# Patient Record
Sex: Male | Born: 1955
Health system: Southern US, Community
[De-identification: ages and names within clinical notes are randomized; demographics above are authoritative.]

## PROBLEM LIST (undated history)

## (undated) DIAGNOSIS — J449 Chronic obstructive pulmonary disease, unspecified: Secondary | ICD-10-CM

## (undated) DIAGNOSIS — F329 Major depressive disorder, single episode, unspecified: Secondary | ICD-10-CM

## (undated) DIAGNOSIS — F32A Depression, unspecified: Secondary | ICD-10-CM

## (undated) DIAGNOSIS — J4489 Other specified chronic obstructive pulmonary disease: Secondary | ICD-10-CM

## (undated) HISTORY — DX: Other specified chronic obstructive pulmonary disease: J44.89

## (undated) HISTORY — DX: Chronic obstructive pulmonary disease, unspecified: J44.9

---

## 1999-03-23 ENCOUNTER — Encounter: Payer: Self-pay | Admitting: Emergency Medicine

## 1999-03-23 ENCOUNTER — Emergency Department (HOSPITAL_COMMUNITY): Admission: EM | Admit: 1999-03-23 | Discharge: 1999-03-23 | Payer: Self-pay | Admitting: Emergency Medicine

## 1999-03-27 ENCOUNTER — Emergency Department (HOSPITAL_COMMUNITY): Admission: EM | Admit: 1999-03-27 | Discharge: 1999-03-27 | Payer: Self-pay | Admitting: Emergency Medicine

## 2002-12-03 ENCOUNTER — Emergency Department (HOSPITAL_COMMUNITY): Admission: EM | Admit: 2002-12-03 | Discharge: 2002-12-03 | Payer: Self-pay | Admitting: Emergency Medicine

## 2015-12-18 ENCOUNTER — Emergency Department (HOSPITAL_COMMUNITY)
Admission: EM | Admit: 2015-12-18 | Discharge: 2015-12-18 | Disposition: A | Discharge status: Home / Self Care | Payer: Medicaid Other | Attending: Emergency Medicine | Admitting: Emergency Medicine

## 2015-12-18 ENCOUNTER — Encounter (HOSPITAL_COMMUNITY): Payer: Self-pay | Admitting: *Deleted

## 2015-12-18 ENCOUNTER — Emergency Department (HOSPITAL_COMMUNITY): Payer: Medicaid Other

## 2015-12-18 DIAGNOSIS — Z880 Allergy status to penicillin: Secondary | ICD-10-CM | POA: Diagnosis not present

## 2015-12-18 DIAGNOSIS — F329 Major depressive disorder, single episode, unspecified: Secondary | ICD-10-CM | POA: Insufficient documentation

## 2015-12-18 DIAGNOSIS — Z79899 Other long term (current) drug therapy: Secondary | ICD-10-CM | POA: Insufficient documentation

## 2015-12-18 DIAGNOSIS — F17200 Nicotine dependence, unspecified, uncomplicated: Secondary | ICD-10-CM | POA: Diagnosis not present

## 2015-12-18 DIAGNOSIS — R0602 Shortness of breath: Secondary | ICD-10-CM | POA: Diagnosis present

## 2015-12-18 DIAGNOSIS — J441 Chronic obstructive pulmonary disease with (acute) exacerbation: Secondary | ICD-10-CM | POA: Diagnosis not present

## 2015-12-18 HISTORY — DX: Depression, unspecified: F32.A

## 2015-12-18 HISTORY — DX: Major depressive disorder, single episode, unspecified: F32.9

## 2015-12-18 LAB — CBC WITH DIFFERENTIAL/PLATELET
BASOS ABS: 0 10*3/uL (ref 0.0–0.1)
Basophils Relative: 0 %
Eosinophils Absolute: 0 10*3/uL (ref 0.0–0.7)
Eosinophils Relative: 0 %
HEMATOCRIT: 46 % (ref 39.0–52.0)
Hemoglobin: 15.8 g/dL (ref 13.0–17.0)
LYMPHS ABS: 1.9 10*3/uL (ref 0.7–4.0)
LYMPHS PCT: 24 %
MCH: 31.2 pg (ref 26.0–34.0)
MCHC: 34.3 g/dL (ref 30.0–36.0)
MCV: 90.9 fL (ref 78.0–100.0)
MONO ABS: 0.4 10*3/uL (ref 0.1–1.0)
Monocytes Relative: 5 %
NEUTROS ABS: 5.6 10*3/uL (ref 1.7–7.7)
Neutrophils Relative %: 71 %
Platelets: 133 10*3/uL — ABNORMAL LOW (ref 150–400)
RBC: 5.06 MIL/uL (ref 4.22–5.81)
RDW: 12.4 % (ref 11.5–15.5)
WBC: 7.8 10*3/uL (ref 4.0–10.5)

## 2015-12-18 LAB — BASIC METABOLIC PANEL
ANION GAP: 12 (ref 5–15)
BUN: 8 mg/dL (ref 6–20)
CALCIUM: 9.2 mg/dL (ref 8.9–10.3)
CO2: 28 mmol/L (ref 22–32)
Chloride: 100 mmol/L — ABNORMAL LOW (ref 101–111)
Creatinine, Ser: 1.19 mg/dL (ref 0.61–1.24)
GFR calc Af Amer: >60 mL/min (ref >60)
GFR calc non Af Amer: >60 mL/min (ref >60)
GLUCOSE: 111 mg/dL — AB (ref 65–99)
POTASSIUM: 3.4 mmol/L — AB (ref 3.5–5.1)
Sodium: 140 mmol/L (ref 135–145)

## 2015-12-18 LAB — TROPONIN I: Troponin I: <0.03 ng/mL (ref <0.031)

## 2015-12-18 MED ORDER — IPRATROPIUM-ALBUTEROL 0.5-2.5 (3) MG/3ML IN SOLN
3.0000 mL | Freq: Once | RESPIRATORY_TRACT | Status: AC
  Administered 2015-12-18: 3 mL via RESPIRATORY_TRACT
  Filled 2015-12-18: qty 3

## 2015-12-18 MED ORDER — PREDNISONE 50 MG PO TABS: ORAL_TABLET | ORAL | Status: DC

## 2015-12-18 MED ORDER — ALBUTEROL SULFATE HFA 108 (90 BASE) MCG/ACT IN AERS
1.0000 | INHALATION_SPRAY | Freq: Once | RESPIRATORY_TRACT | Status: AC
  Administered 2015-12-18: 2 via RESPIRATORY_TRACT
  Filled 2015-12-18: qty 6.7

## 2015-12-18 MED ORDER — ALBUTEROL SULFATE HFA 108 (90 BASE) MCG/ACT IN AERS: 1.0000 | INHALATION_SPRAY | Freq: Four times a day (QID) | RESPIRATORY_TRACT | Status: DC | PRN

## 2015-12-18 MED ORDER — PREDNISONE 20 MG PO TABS
60.0000 mg | ORAL_TABLET | Freq: Once | ORAL | Status: AC
  Administered 2015-12-18: 60 mg via ORAL
  Filled 2015-12-18: qty 3

## 2015-12-18 MED ORDER — ALBUTEROL SULFATE (2.5 MG/3ML) 0.083% IN NEBU
2.5000 mg | INHALATION_SOLUTION | Freq: Once | RESPIRATORY_TRACT | Status: AC
  Administered 2015-12-18: 2.5 mg via RESPIRATORY_TRACT
  Filled 2015-12-18: qty 3

## 2015-12-18 NOTE — Discharge Instructions (Signed)
Prescription for inhaler and prednisone. You need to get a primary care doctor. Phone number given.

## 2015-12-18 NOTE — ED Notes (Signed)
Pt stable, ambulatory, states understanding of discharge instructions 

## 2015-12-18 NOTE — ED Notes (Signed)
Pt states that he has had SOB for over 1 month that's worse with exertion. Pt states that he was started on new medications recently as well for depression. Pt denies chest pain. NAD noted in triage

## 2015-12-18 NOTE — ED Provider Notes (Signed)
CSN: [id number]     Arrival date & time 12/18/15  1236 History   First MD Initiated Contact with Patient 12/18/15 1622     Chief Complaint  Patient presents with  . Shortness of Breath     (Consider location/radiation/quality/duration/timing/severity/associated sxs/prior Treatment) HPI..... Patient with COPD and long-term smoking presents with cough, wheezing and shortness of breath for 1 month. No substernal chest pain. Patient does not have regular primary care follow-up. Severity of symptoms mild to moderate.  Past Medical History  Diagnosis Date  . Depression    History reviewed. No pertinent past surgical history. No family history on file. Social History  Substance Use Topics  . Smoking status: Current Every Day Smoker  . Smokeless tobacco: None  . Alcohol Use: None    Review of Systems  All other systems reviewed and are negative.     Allergies  Penicillins  Home Medications   Prior to Admission medications   Medication Sig Start Date End Date Taking? Authorizing Provider  acetaminophen (TYLENOL) 500 MG tablet Take 500 mg by mouth every 6 (six) hours as needed for headache.   Yes Historical Provider, MD  citalopram (CELEXA) 20 MG tablet Take 20 mg by mouth daily.   Yes Historical Provider, MD  guaifenesin (ROBITUSSIN) 100 MG/5ML syrup Take 200 mg by mouth 3 (three) times daily as needed for cough.   Yes Historical Provider, MD  albuterol (PROVENTIL HFA;VENTOLIN HFA) 108 (90 Base) MCG/ACT inhaler Inhale 1-2 puffs into the lungs every 6 (six) hours as needed for wheezing or shortness of breath. 12/18/15   Donnetta Hutching, MD  predniSONE (DELTASONE) 50 MG tablet One tab daily for 6 days;  One half tab daily for 6 days 12/18/15   Donnetta Hutching, MD   BP 117/75 mmHg  Pulse 74  Temp(Src) 98.1 F (36.7 C) (Oral)  Resp 21  SpO2 97% Physical Exam  Constitutional: He is oriented to person, place, and time. He appears well-developed and well-nourished.  HENT:  Head:  Normocephalic and atraumatic.  Eyes: Conjunctivae and EOM are normal. Pupils are equal, round, and reactive to light.  Neck: Normal range of motion. Neck supple.  Cardiovascular: Normal rate and regular rhythm.   Pulmonary/Chest: Effort normal.  Bilateral expiratory wheeze  Abdominal: Soft. Bowel sounds are normal.  Musculoskeletal: Normal range of motion.  Neurological: He is alert and oriented to person, place, and time.  Skin: Skin is warm and dry.  Psychiatric: He has a normal mood and affect. His behavior is normal.  Nursing note and vitals reviewed.   ED Course  Procedures (including critical care time) Labs Review Labs Reviewed  CBC WITH DIFFERENTIAL/PLATELET - Abnormal; Notable for the following:    Platelets 133 (*)    All other components within normal limits  BASIC METABOLIC PANEL - Abnormal; Notable for the following:    Potassium 3.4 (*)    Chloride 100 (*)    Glucose, Bld 111 (*)    All other components within normal limits  TROPONIN I    Imaging Review Dg Chest 2 View  12/18/2015  CLINICAL DATA:  Shortness of breath, panic attack 4 days ago, smoker EXAM: CHEST  2 VIEW COMPARISON:  None. FINDINGS: Cardiomediastinal silhouette is unremarkable. No acute infiltrate or pleural effusion. No pulmonary edema pre mild perihilar bronchitic changes. Mild hyperinflation. Bony thorax is unremarkable. IMPRESSION: No infiltrate or pulmonary edema. Mild perihilar bronchitic changes. Mild hyperinflation. Electronically Signed   By: Natasha Mead M.D.   On: 12/18/2015 13:17  I have personally reviewed and evaluated these images and lab results as part of my medical decision-making.   EKG Interpretation   Date/Time:  Thursday December 18 2015 12:45:10 EST Ventricular Rate:  91 PR Interval:  142 QRS Duration: 74 QT Interval:  358 QTC Calculation: 440 R Axis:   97 Text Interpretation:  Normal sinus rhythm Rightward axis Septal infarct ,  age undetermined Abnormal ECG Confirmed by  Treyvone Chelf  MD, Carmichael Burdette ([id number]) on  12/18/2015 4:35:59 PM      MDM   Final diagnoses:  COPD exacerbation (HCC)    Patient is in no acute distress. I suspect he has long-standing COPD. EKG and troponin negative. Chest x-ray shows no infiltrate or pulmonary edema. He feels better after nebulizer treatment. Discharge medications prednisone and albuterol inhaler.    Donnetta Hutching, MD 12/18/15 2047

## 2015-12-20 ENCOUNTER — Telehealth: Payer: Self-pay | Admitting: *Deleted

## 2015-12-20 NOTE — Telephone Encounter (Signed)
Isaac Harding reports that he was seen in the ED on Thursday of last week and was given Rx and an inhaler that equalled $[facility].00 [facility]. He reports that he is in the process of receiving Medicaid and is unable to afford his medication, requesting samples. CM offered [employer] Coupons which seemed to satisfy [name].  [facility] and spoke with Pharmacist who clarified that Prednisone is inexpensive. Inhaler, which the pt says he has, is the most expensive medicine. Pharmacist will call the pt back and offer whatever discount she is able through Good Rx.

## 2016-04-07 ENCOUNTER — Ambulatory Visit: Payer: Medicaid Other | Admitting: Critical Care Medicine

## 2016-04-14 ENCOUNTER — Ambulatory Visit: Payer: Medicaid Other | Attending: Critical Care Medicine | Admitting: Critical Care Medicine

## 2016-04-14 ENCOUNTER — Encounter: Payer: Self-pay | Admitting: Critical Care Medicine

## 2016-04-14 ENCOUNTER — Other Ambulatory Visit: Payer: Self-pay | Admitting: General Practice

## 2016-04-14 VITALS — BP 128/89 | HR 82 | Temp 98.2°F | Resp 18 | Ht 68.0 in | Wt 172.0 lb

## 2016-04-14 DIAGNOSIS — F17210 Nicotine dependence, cigarettes, uncomplicated: Secondary | ICD-10-CM | POA: Diagnosis not present

## 2016-04-14 DIAGNOSIS — J449 Chronic obstructive pulmonary disease, unspecified: Secondary | ICD-10-CM

## 2016-04-14 DIAGNOSIS — J45909 Unspecified asthma, uncomplicated: Secondary | ICD-10-CM | POA: Diagnosis not present

## 2016-04-14 DIAGNOSIS — Z79899 Other long term (current) drug therapy: Secondary | ICD-10-CM | POA: Insufficient documentation

## 2016-04-14 DIAGNOSIS — Z880 Allergy status to penicillin: Secondary | ICD-10-CM | POA: Insufficient documentation

## 2016-04-14 DIAGNOSIS — J3089 Other allergic rhinitis: Secondary | ICD-10-CM | POA: Diagnosis not present

## 2016-04-14 DIAGNOSIS — R0982 Postnasal drip: Secondary | ICD-10-CM

## 2016-04-14 DIAGNOSIS — J309 Allergic rhinitis, unspecified: Secondary | ICD-10-CM

## 2016-04-14 MED ORDER — TIOTROPIUM BROMIDE MONOHYDRATE 18 MCG IN CAPS: 18.0000 ug | ORAL_CAPSULE | Freq: Every day | RESPIRATORY_TRACT | Status: DC

## 2016-04-14 MED ORDER — FLUTICASONE PROPIONATE 50 MCG/ACT NA SUSP: 2.0000 | Freq: Every day | NASAL | Status: DC

## 2016-04-14 NOTE — Assessment & Plan Note (Signed)
Copd with chronic bronchitis  Allergic rhinitis Plan  Start spiriva daily Start fluticasone daily Prn saba Stop smoking

## 2016-04-14 NOTE — Patient Instructions (Signed)
Start Spiriva one capsule , in handi haler daily Start Fluticasone two puff ea nostril daily Albuterol as needed Stop smoking  Return 3 months

## 2016-04-14 NOTE — Progress Notes (Signed)
Patient is here for COPD  Patient has not taken medication today and patient has eaten today.  Patient request refills on Inhaler and allergy medication.  Patient request referral to the dentist in .

## 2016-04-14 NOTE — Telephone Encounter (Signed)
PT. Had an appointment today and stated he forgot to ask the PCP that he bruises really easily and he need an Rx for blood thinner. Please f/u with pt.

## 2016-04-14 NOTE — Progress Notes (Signed)
Subjective:    Patient ID: Isaac Harding, male    DOB: [birth year], 60 y.o.   MRN: [id number]  HPI Comments: Here for copd f/u  Last ED visit 11/2015 rx pred pulse.  Here to establish pulm care .  Still smoking. Needs refills on inhalers. Symptoms since 09/2015.  Smokes < 1 PPD.  Needs dental work.    Shortness of Breath This is a chronic problem. The current episode started more than 1 month ago. The problem occurs constantly (dyspnea at rest and exertion). The problem has been unchanged. Associated symptoms include sputum production and wheezing. Pertinent negatives include no abdominal pain, chest pain, orthopnea or PND. The symptoms are aggravated by any activity, exercise, eating, lying flat, odors and fumes. Associated symptoms comments: Mucus  Is brown. Risk factors include smoking. He has tried beta agonist inhalers for the symptoms. The treatment provided moderate relief. His past medical history is significant for COPD. There is no history of CAD, a heart failure or pneumonia.    Past Medical History  Diagnosis Date  . Depression   . COPD with chronic bronchitis (HCC)      History reviewed. No pertinent family history.   Social History   Social History  . Marital Status: Divorced    Spouse Name: N/A  . Number of Children: N/A  . Years of Education: N/A   Occupational History  . Not on file.   Social History Main Topics  . Smoking status: Current Every Day Smoker -- 0.25 packs/day    Types: Cigarettes  . Smokeless tobacco: Not on file  . Alcohol Use: No  . Drug Use: Not on file  . Sexual Activity: Not on file   Other Topics Concern  . Not on file   Social History Narrative     Allergies  Allergen Reactions  . Penicillins Itching     Outpatient Prescriptions Prior to Visit  Medication Sig Dispense Refill  . acetaminophen (TYLENOL) 500 MG tablet Take 500 mg by mouth every 6 (six) hours as needed for headache.    . albuterol (PROVENTIL HFA;VENTOLIN HFA)  108 (90 Base) MCG/ACT inhaler Inhale 1-2 puffs into the lungs every 6 (six) hours as needed for wheezing or shortness of breath. 1 Inhaler 2  . citalopram (CELEXA) 20 MG tablet Take 20 mg by mouth daily.    Marland Kitchen. guaifenesin (ROBITUSSIN) 100 MG/5ML syrup Take 200 mg by mouth 3 (three) times daily as needed for cough. Reported on 04/14/2016    . predniSONE (DELTASONE) 50 MG tablet One tab daily for 6 days;  One half tab daily for 6 days (Patient not taking: Reported on 04/14/2016) 9 tablet 0   No facility-administered medications prior to visit.      Review of Systems  Respiratory: Positive for cough, sputum production, chest tightness, shortness of breath and wheezing.   Cardiovascular: Negative for chest pain, orthopnea and PND.  Gastrointestinal: Negative for abdominal pain.       Objective:   Physical Exam There were no vitals filed for this visit.  Gen: Pleasant, well-nourished, in no distress,  normal affect  ENT: No lesions,  mouth clear,  oropharynx clear, no postnasal drip  Neck: No JVD, no TMG, no carotid bruits  Lungs: No use of accessory muscles, no dullness to percussion, distant Bs  Cardiovascular: RRR, heart sounds normal, no murmur or gallops, no peripheral edema  Abdomen: soft and NT, no HSM,  BS normal  Musculoskeletal: No deformities, no cyanosis or clubbing  Neuro: alert, non focal  Skin: Warm, no lesions or rashes  No results found.  cxr reviewed      Assessment & Plan:  I personally reviewed all images and lab data in the [facility] system as well as any outside material available during this office visit and agree with the  radiology impressions.   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  Diagnoses and all orders for this visit:  COPD with chronic bronchitis (HCC)  Allergic rhinitis with postnasal  drip  Other orders -     tiotropium (SPIRIVA HANDIHALER) 18 MCG inhalation capsule; Place 1 capsule (18 mcg total) into inhaler and inhale daily. -     fluticasone (FLONASE) 50 MCG/ACT nasal spray; Place 2 sprays into both nostrils daily.

## 2016-05-10 MED ORDER — ALBUTEROL SULFATE HFA 108 (90 BASE) MCG/ACT IN AERS: 1.0000 | INHALATION_SPRAY | Freq: Four times a day (QID) | RESPIRATORY_TRACT | Status: DC | PRN

## 2016-05-10 NOTE — Telephone Encounter (Signed)
Refilled the albuterol - Dr. Delford FieldWright wanted patient on the medication.

## 2016-05-10 NOTE — Telephone Encounter (Signed)
Pt. Called requesting a refill for albuterol (PROVENTIL HFA;VENTOLIN HFA) 108 (90 Base) MCG/ACT inhaler. Pt. Was advised that the medication was not prescribed by his PCP. He stated if he could get a refill and would like the  [facility] [place]. Please f/u with pt.

## 2016-07-21 ENCOUNTER — Ambulatory Visit: Payer: Medicaid Other | Attending: Critical Care Medicine | Admitting: Critical Care Medicine

## 2016-07-21 ENCOUNTER — Encounter: Payer: Self-pay | Admitting: Critical Care Medicine

## 2016-07-21 VITALS — BP 103/70 | HR 80 | Temp 98.2°F | Resp 18 | Ht 68.0 in | Wt 171.8 lb

## 2016-07-21 DIAGNOSIS — Z23 Encounter for immunization: Secondary | ICD-10-CM | POA: Diagnosis not present

## 2016-07-21 DIAGNOSIS — Z79899 Other long term (current) drug therapy: Secondary | ICD-10-CM | POA: Insufficient documentation

## 2016-07-21 DIAGNOSIS — Z0000 Encounter for general adult medical examination without abnormal findings: Secondary | ICD-10-CM | POA: Diagnosis not present

## 2016-07-21 DIAGNOSIS — F17210 Nicotine dependence, cigarettes, uncomplicated: Secondary | ICD-10-CM | POA: Diagnosis not present

## 2016-07-21 DIAGNOSIS — F17200 Nicotine dependence, unspecified, uncomplicated: Secondary | ICD-10-CM | POA: Diagnosis not present

## 2016-07-21 DIAGNOSIS — J449 Chronic obstructive pulmonary disease, unspecified: Secondary | ICD-10-CM | POA: Insufficient documentation

## 2016-07-21 MED ORDER — ALBUTEROL SULFATE HFA 108 (90 BASE) MCG/ACT IN AERS: 1.0000 | INHALATION_SPRAY | Freq: Four times a day (QID) | RESPIRATORY_TRACT | 4 refills | Status: DC | PRN

## 2016-07-21 MED ORDER — NICOTINE 10 MG IN INHA: RESPIRATORY_TRACT | 2 refills | Status: DC

## 2016-07-21 MED ORDER — TIOTROPIUM BROMIDE MONOHYDRATE 18 MCG IN CAPS: 18.0000 ug | ORAL_CAPSULE | Freq: Every day | RESPIRATORY_TRACT | 6 refills | Status: DC

## 2016-07-21 MED ORDER — FLUTICASONE PROPIONATE 50 MCG/ACT NA SUSP: 2.0000 | Freq: Every day | NASAL | 6 refills | Status: DC

## 2016-07-21 NOTE — Assessment & Plan Note (Signed)
Copd with ongoing tobacco use Improved with spiriva and nasal steroid Plan  Cont spiriva daily Cont prn saba Cont flonase daily

## 2016-07-21 NOTE — Assessment & Plan Note (Signed)
Ongoing tobacco use Plan  Nicotrol inhaler 60-80 puff per cartridge, 6 cartridges per day Counseling issued to the patient

## 2016-07-21 NOTE — Patient Instructions (Signed)
Stop smoking ,use nicotrol inhaler , 60-80 puff per day, use 6 cartridges per day Stay on spiriva daily and fluticasone nasal daily Albuterol as needed Flu vaccine and pneumovax given Return 4 months

## 2016-07-21 NOTE — Progress Notes (Signed)
Patient tolerated injections well today. 

## 2016-07-21 NOTE — Progress Notes (Signed)
Subjective:    Patient ID: Isaac Harding, male    DOB: 1956/11/11, 60 y.o.   MRN: [id number]  Here for copd f/u  At last OV 03/2016: Refilled spiriva and counseled on smoking cessation.  Since last ov about the same.  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 The current episode started more than 1 month ago. The problem occurs daily (dyspnea at  exertion only). The problem has been gradually improving. Associated symptoms include rhinorrhea, sputum production and wheezing. Pertinent negatives include no abdominal pain, chest pain, orthopnea or PND. The symptoms are aggravated by any activity, exercise, eating, lying flat, odors and fumes. Associated symptoms comments: Mucus is yellow Wheezing is better. Risk factors include smoking. He has tried beta agonist inhalers and ipratropium inhalers for the symptoms. The treatment provided moderate relief. His past medical history is significant for COPD. There is no history of CAD, a heart failure or pneumonia.    Past Medical History:  Diagnosis Date  . COPD with chronic bronchitis (HCC)   . Depression      No family history on file.   Social History   Social History  . Marital status: Divorced    Spouse name: N/A  . Number of children: N/A  . Years of education: N/A   Occupational History  . Not on file.   Social History Main Topics  . Smoking status: Current Every Day Smoker    Packs/day: 0.25    Types: Cigarettes  . Smokeless tobacco: Not on file  . Alcohol use No  . Drug use: Unknown  . Sexual activity: Not on file   Other Topics Concern  . Not on file   Social History Narrative  . No narrative on file     Allergies  Allergen Reactions  . Penicillins Itching     Outpatient Medications Prior to Visit  Medication Sig Dispense Refill  . acetaminophen (TYLENOL) 500 MG tablet Take 500 mg by mouth every 6 (six) hours as needed for headache.    . albuterol (PROVENTIL  HFA;VENTOLIN HFA) 108 (90 Base) MCG/ACT inhaler Inhale 1-2 puffs into the lungs every 6 (six) hours as needed for wheezing or shortness of breath. 1 Inhaler 2  . citalopram (CELEXA) 20 MG tablet Take 20 mg by mouth daily.    . fluticasone (FLONASE) 50 MCG/ACT nasal spray Place 2 sprays into both nostrils daily. 16 g 6  . tiotropium (SPIRIVA HANDIHALER) 18 MCG inhalation capsule Place 1 capsule (18 mcg total) into inhaler and inhale daily. 30 capsule 6   No facility-administered medications prior to visit.       Review of Systems  HENT: Positive for postnasal drip and rhinorrhea.   Respiratory: Positive for cough, sputum production, chest tightness, shortness of breath and wheezing.   Cardiovascular: Negative for chest pain, orthopnea and PND.  Gastrointestinal: Negative for abdominal pain.       Objective:   Physical Exam Vitals:   07/21/16 0919  BP: 103/70  Pulse: 80  Resp: 18  Temp: 98.2 F (36.8 C)  TempSrc: Oral  SpO2: 98%  Weight: 171 lb 12.8 oz (77.9 kg)  Height: 5\' 8"  (1.727 m)    Gen: Pleasant, well-nourished, in no distress,  normal affect  ENT: No lesions,  mouth clear,  oropharynx clear, no postnasal drip  Neck: No JVD, no TMG, no carotid bruits  Lungs: No use of accessory muscles, no dullness to percussion, distant  Bs  Cardiovascular: RRR, heart sounds normal, no murmur or gallops, no peripheral edema  Abdomen: soft and NT, no HSM,  BS normal  Musculoskeletal: No deformities, no cyanosis or clubbing  Neuro: alert, non focal  Skin: Warm, no lesions or rashes  No results found.  cxr reviewed      Assessment & Plan:  I personally reviewed all images and lab data in the [facility] system as well as any outside material available during this office visit and agree with the  radiology impressions.   COPD with chronic bronchitis (HCC) Copd with ongoing tobacco use Improved with spiriva and nasal steroid Plan  Cont spiriva daily Cont prn saba Cont  flonase daily  Tobacco use disorder Ongoing tobacco use Plan  Nicotrol inhaler 60-80 puff per cartridge, 6 cartridges per day Counseling issued to the patient   Isaac Harding was seen today for copd.  Diagnoses and all orders for this visit:  COPD with chronic bronchitis (HCC)  Healthcare maintenance -     Flu Vaccine QUAD 36+ mos PF IM (Fluarix & Fluzone Quad PF)  Tobacco use disorder  Other orders -     Pneumococcal polysaccharide vaccine 23-valent greater than or equal to 2yo subcutaneous/IM -     tiotropium (SPIRIVA HANDIHALER) 18 MCG inhalation capsule; Place 1 capsule (18 mcg total) into inhaler and inhale daily. -     albuterol (PROVENTIL HFA;VENTOLIN HFA) 108 (90 Base) MCG/ACT inhaler; Inhale 1-2 puffs into the lungs every 6 (six) hours as needed for wheezing or shortness of breath. -     fluticasone (FLONASE) 50 MCG/ACT nasal spray; Place 2 sprays into both nostrils daily. -     nicotine (NICOTROL) 10 MG inhaler; 60-80 puff per cartridge, use 6 cartridges per day

## 2016-08-18 ENCOUNTER — Telehealth: Payer: Self-pay | Admitting: General Practice

## 2016-08-18 MED ORDER — NICOTINE 10 MG IN INHA: RESPIRATORY_TRACT | 2 refills | Status: DC

## 2016-08-18 NOTE — Telephone Encounter (Signed)
Nicotine inhaler approved through [employer]  Approval [id number]

## 2016-08-18 NOTE — Telephone Encounter (Signed)
Pharmacy aware of prior authorization approval.

## 2016-08-18 NOTE — Telephone Encounter (Signed)
Pt. Called stating that he was prescribed nicotine (NICOTROL) 10 MG inhaler  And he has not been able to get it since it was prescribed. Please f/u

## 2016-12-02 ENCOUNTER — Ambulatory Visit: Payer: Medicaid Other | Attending: Family Medicine | Admitting: Family Medicine

## 2016-12-02 ENCOUNTER — Encounter: Payer: Self-pay | Admitting: Family Medicine

## 2016-12-02 VITALS — BP 117/72 | HR 89 | Temp 98.6°F | Ht 68.0 in | Wt 174.6 lb

## 2016-12-02 DIAGNOSIS — Z79899 Other long term (current) drug therapy: Secondary | ICD-10-CM | POA: Insufficient documentation

## 2016-12-02 DIAGNOSIS — B9789 Other viral agents as the cause of diseases classified elsewhere: Secondary | ICD-10-CM

## 2016-12-02 DIAGNOSIS — J069 Acute upper respiratory infection, unspecified: Secondary | ICD-10-CM | POA: Diagnosis not present

## 2016-12-02 DIAGNOSIS — Z7951 Long term (current) use of inhaled steroids: Secondary | ICD-10-CM | POA: Diagnosis not present

## 2016-12-02 DIAGNOSIS — J449 Chronic obstructive pulmonary disease, unspecified: Secondary | ICD-10-CM

## 2016-12-02 DIAGNOSIS — F17210 Nicotine dependence, cigarettes, uncomplicated: Secondary | ICD-10-CM | POA: Insufficient documentation

## 2016-12-02 MED ORDER — ALBUTEROL SULFATE HFA 108 (90 BASE) MCG/ACT IN AERS: 1.0000 | INHALATION_SPRAY | Freq: Four times a day (QID) | RESPIRATORY_TRACT | 4 refills | Status: DC | PRN

## 2016-12-02 MED ORDER — TIOTROPIUM BROMIDE MONOHYDRATE 18 MCG IN CAPS: 18.0000 ug | ORAL_CAPSULE | Freq: Every day | RESPIRATORY_TRACT | 6 refills | Status: DC

## 2016-12-02 MED ORDER — GUAIFENESIN ER 600 MG PO TB12: 600.0000 mg | ORAL_TABLET | Freq: Two times a day (BID) | ORAL | 0 refills | Status: DC | PRN

## 2016-12-02 MED ORDER — BENZONATATE 100 MG PO CAPS: 100.0000 mg | ORAL_CAPSULE | Freq: Two times a day (BID) | ORAL | 0 refills | Status: DC | PRN

## 2016-12-02 MED FILL — BENZONATATE 100 MG CAPSULE: 100 | 10 days supply | Qty: 20 | Fill #0

## 2016-12-02 NOTE — Assessment & Plan Note (Signed)
Viral URI x 2 days Supportive care  Tessalon Perles and mucinex

## 2016-12-02 NOTE — Patient Instructions (Addendum)
Isaac Harding was seen today for copd.  Diagnoses and all orders for this visit:  COPD with chronic bronchitis (HCC)  Viral URI with cough -     Discontinue: benzonatate (TESSALON) 100 MG capsule; Take 1 capsule (100 mg total) by mouth 2 (two) times daily as needed for cough. -     Discontinue: guaiFENesin (MUCINEX) 600 MG 12 hr tablet; Take 1 tablet (600 mg total) by mouth 2 (two) times daily as needed. -     benzonatate (TESSALON) 100 MG capsule; Take 1 capsule (100 mg total) by mouth 2 (two) times daily as needed for cough. -     guaiFENesin (MUCINEX) 600 MG 12 hr tablet; Take 1 tablet (600 mg total) by mouth 2 (two) times daily as needed.  Other orders -     tiotropium (SPIRIVA HANDIHALER) 18 MCG inhalation capsule; Place 1 capsule (18 mcg total) into inhaler and inhale daily. -     albuterol (PROVENTIL HFA;VENTOLIN HFA) 108 (90 Base) MCG/ACT inhaler; Inhale 1-2 puffs into the lungs every 6 (six) hours as needed for wheezing or shortness of breath.   You have a viral URI with cough. For this please do the following:  1. Drink plenty of fluids. Hot tea, soup etc will help open your nasal passages. 2. Dextromethorphan 30 mg every 6-8 hrs (plain Robitussin) for cough suppression 3. Tylenol for pain up to 1000 mg three times daily.  4. Nasal saline-OTC nose spray for congestion.   F/u for chest pain, shortness of breath, persistent high fever.   Call if symptoms worsens or fail to improve of next 5 days.   F/u in 3 months for COPD  Dr. Armen PickupFunches

## 2016-12-02 NOTE — Assessment & Plan Note (Signed)
COPD smoking cessation addressed Continue spiriva and albuterol prn   No evidence of COPD exacerbation

## 2016-12-02 NOTE — Progress Notes (Signed)
Subjective:  Patient ID: Isaac Harding, male    DOB: 09-06-1956  Age: 61 y.o. MRN: [id number]  CC: COPD   HPI Isaac Mccoyerry L Lochridge presents for    1. COPD: continues to smoke, 1/4/ PPD. Using Spiriva daily. Also taking albuterol every 6 hrs. Over the past 2 days reports felling tired and achy. Also getting headaches nightly. No CP. He has some SOB with exertion.   Social History  Substance Use Topics  . Smoking status: Current Every Day Smoker    Packs/day: 0.25    Types: Cigarettes  . Smokeless tobacco: Not on file  . Alcohol use No    Outpatient Medications Prior to Visit  Medication Sig Dispense Refill  . acetaminophen (TYLENOL) 500 MG tablet Take 500 mg by mouth every 6 (six) hours as needed for headache.    . albuterol (PROVENTIL HFA;VENTOLIN HFA) 108 (90 Base) MCG/ACT inhaler Inhale 1-2 puffs into the lungs every 6 (six) hours as needed for wheezing or shortness of breath. 1 Inhaler 4  . citalopram (CELEXA) 10 MG tablet Take 30 mg by mouth every morning.  2  . fluticasone (FLONASE) 50 MCG/ACT nasal spray Place 2 sprays into both nostrils daily. 16 g 6  . nicotine (NICOTROL) 10 MG inhaler 60-80 puff per cartridge, use 6 cartridges per day 168 each 2  . tiotropium (SPIRIVA HANDIHALER) 18 MCG inhalation capsule Place 1 capsule (18 mcg total) into inhaler and inhale daily. 30 capsule 6   No facility-administered medications prior to visit.     ROS Review of Systems  Constitutional: Positive for fatigue. Negative for chills, fever and unexpected weight change.  Eyes: Negative for visual disturbance.  Respiratory: Positive for cough and shortness of breath.   Cardiovascular: Negative for chest pain, palpitations and leg swelling.  Gastrointestinal: Negative for abdominal pain, blood in stool, constipation, diarrhea, nausea and vomiting.  Endocrine: Negative for polydipsia, polyphagia and polyuria.  Musculoskeletal: Positive for myalgias. Negative for arthralgias, back pain,  gait problem and neck pain.  Skin: Negative for rash.  Allergic/Immunologic: Negative for immunocompromised state.  Hematological: Negative for adenopathy. Does not bruise/bleed easily.  Psychiatric/Behavioral: Negative for dysphoric mood, sleep disturbance and suicidal ideas. The patient is not nervous/anxious.     Objective:  BP 117/72 (BP Location: Left Arm, Patient Position: Sitting, Cuff Size: Small)   Pulse 89   Temp 98.6 F (37 C) (Oral)   Ht 5\' 8"  (1.727 m)   Wt 174 lb 9.6 oz (79.2 kg)   SpO2 97%   BMI 26.55 kg/m   BP/Weight 12/02/2016 07/21/2016 04/14/2016  Systolic BP 117 103 128  Diastolic BP 72 70 89  Wt. (Lbs) 174.6 171.8 172  BMI 26.55 26.12 26.16    Physical Exam  Constitutional: He appears well-developed and well-nourished. No distress.  HENT:  Head: Normocephalic and atraumatic.  Neck: Normal range of motion. Neck supple.  Cardiovascular: Normal rate, regular rhythm, normal heart sounds and intact distal pulses.   Pulmonary/Chest: Effort normal and breath sounds normal.  Musculoskeletal: He exhibits no edema.  Neurological: He is alert.  Skin: Skin is warm and dry. No rash noted. No erythema.  Psychiatric: He has a normal mood and affect.    Assessment & Plan:  Aurther Lofterry was seen today for copd.  Diagnoses and all orders for this visit:  COPD with chronic bronchitis (HCC)  Viral URI with cough -     Discontinue: benzonatate (TESSALON) 100 MG capsule; Take 1 capsule (100 mg total) by mouth  2 (two) times daily as needed for cough. -     Discontinue: guaiFENesin (MUCINEX) 600 MG 12 hr tablet; Take 1 tablet (600 mg total) by mouth 2 (two) times daily as needed. -     benzonatate (TESSALON) 100 MG capsule; Take 1 capsule (100 mg total) by mouth 2 (two) times daily as needed for cough. -     guaiFENesin (MUCINEX) 600 MG 12 hr tablet; Take 1 tablet (600 mg total) by mouth 2 (two) times daily as needed.  Other orders -     tiotropium (SPIRIVA HANDIHALER) 18 MCG  inhalation capsule; Place 1 capsule (18 mcg total) into inhaler and inhale daily. -     albuterol (PROVENTIL HFA;VENTOLIN HFA) 108 (90 Base) MCG/ACT inhaler; Inhale 1-2 puffs into the lungs every 6 (six) hours as needed for wheezing or shortness of breath.   There are no diagnoses linked to this encounter.  No orders of the defined types were placed in this encounter.   Follow-up: No Follow-up on file.   Dessa Phi MD

## 2017-02-08 ENCOUNTER — Telehealth: Payer: Self-pay | Admitting: Family Medicine

## 2017-02-08 MED ORDER — FLUTICASONE PROPIONATE 50 MCG/ACT NA SUSP: 2.0000 | Freq: Every day | NASAL | 6 refills | Status: DC

## 2017-02-08 NOTE — Telephone Encounter (Signed)
Patient called the office to request medication refill on fluticasone (FLONASE) 50 MCG/ACT nasal spray. Please send to [facility] [place].   Thank you.

## 2017-02-08 NOTE — Telephone Encounter (Signed)
Fluticasone refilled

## 2017-02-21 ENCOUNTER — Encounter: Payer: Self-pay | Admitting: Family Medicine

## 2017-02-21 ENCOUNTER — Ambulatory Visit: Payer: Medicaid Other | Attending: Family Medicine | Admitting: Family Medicine

## 2017-02-21 VITALS — BP 108/79 | HR 78 | Temp 97.5°F | Ht 68.0 in | Wt 172.0 lb

## 2017-02-21 DIAGNOSIS — Z114 Encounter for screening for human immunodeficiency virus [HIV]: Secondary | ICD-10-CM | POA: Diagnosis not present

## 2017-02-21 DIAGNOSIS — Z0000 Encounter for general adult medical examination without abnormal findings: Secondary | ICD-10-CM | POA: Diagnosis not present

## 2017-02-21 DIAGNOSIS — F17200 Nicotine dependence, unspecified, uncomplicated: Secondary | ICD-10-CM

## 2017-02-21 DIAGNOSIS — Z79899 Other long term (current) drug therapy: Secondary | ICD-10-CM | POA: Insufficient documentation

## 2017-02-21 DIAGNOSIS — J449 Chronic obstructive pulmonary disease, unspecified: Secondary | ICD-10-CM | POA: Diagnosis not present

## 2017-02-21 DIAGNOSIS — F17210 Nicotine dependence, cigarettes, uncomplicated: Secondary | ICD-10-CM | POA: Insufficient documentation

## 2017-02-21 DIAGNOSIS — Z7951 Long term (current) use of inhaled steroids: Secondary | ICD-10-CM | POA: Insufficient documentation

## 2017-02-21 DIAGNOSIS — Z1159 Encounter for screening for other viral diseases: Secondary | ICD-10-CM | POA: Diagnosis not present

## 2017-02-21 MED ORDER — VARENICLINE TARTRATE 0.5 MG X 11 & 1 MG X 42 PO MISC: ORAL | 0 refills | Status: DC

## 2017-02-21 MED ORDER — VARENICLINE TARTRATE 1 MG PO TABS: 1.0000 mg | ORAL_TABLET | Freq: Two times a day (BID) | ORAL | 1 refills | Status: DC

## 2017-02-21 NOTE — Progress Notes (Addendum)
Subjective:  Patient ID: Isaac Harding, male    DOB: 1956/05/28  Age: 61 y.o. MRN: [id number]  CC: COPD   HPI Isaac Harding presents for    1. COPD: continues to smoke, 1/4/ PPD. He previously smoked close to 1 PPD since age 14. He is using nicotrol inhaler. Using Spiriva daily. Also taking albuterol every 6 hrs. Over the past 2 days reports felling tired and achy. No CP. He has some SOB with exertion.    2. Healthcare maintenance: amenable to screening HIV and Hep C. He has never has a screening c-scope. He is amenable. His brother works but may be available to drive him to and from the procedure.   Social History  Substance Use Topics  . Smoking status: Current Every Day Smoker    Packs/day: 0.25    Types: Cigarettes  . Smokeless tobacco: Never Used  . Alcohol use No    Outpatient Medications Prior to Visit  Medication Sig Dispense Refill  . acetaminophen (TYLENOL) 500 MG tablet Take 500 mg by mouth every 6 (six) hours as needed for headache.    . albuterol (PROVENTIL HFA;VENTOLIN HFA) 108 (90 Base) MCG/ACT inhaler Inhale 1-2 puffs into the lungs every 6 (six) hours as needed for wheezing or shortness of breath. 1 Inhaler 4  . citalopram (CELEXA) 10 MG tablet Take 30 mg by mouth every morning.  2  . fluticasone (FLONASE) 50 MCG/ACT nasal spray Place 2 sprays into both nostrils daily. 16 g 6  . guaiFENesin (MUCINEX) 600 MG 12 hr tablet Take 1 tablet (600 mg total) by mouth 2 (two) times daily as needed. 30 tablet 0  . nicotine (NICOTROL) 10 MG inhaler 60-80 puff per cartridge, use 6 cartridges per day 168 each 2  . tiotropium (SPIRIVA HANDIHALER) 18 MCG inhalation capsule Place 1 capsule (18 mcg total) into inhaler and inhale daily. 30 capsule 6  . benzonatate (TESSALON) 100 MG capsule Take 1 capsule (100 mg total) by mouth 2 (two) times daily as needed for cough. (Patient not taking: Reported on 02/21/2017) 20 capsule 0   No facility-administered medications prior to visit.       ROS Review of Systems  Constitutional: Positive for fatigue. Negative for chills, fever and unexpected weight change.  Eyes: Negative for visual disturbance.  Respiratory: Positive for cough and shortness of breath.   Cardiovascular: Negative for chest pain, palpitations and leg swelling.  Gastrointestinal: Negative for abdominal pain, blood in stool, constipation, diarrhea, nausea and vomiting.  Endocrine: Negative for polydipsia, polyphagia and polyuria.  Musculoskeletal: Positive for myalgias. Negative for arthralgias, back pain, gait problem and neck pain.  Skin: Negative for rash.  Allergic/Immunologic: Negative for immunocompromised state.  Hematological: Negative for adenopathy. Does not bruise/bleed easily.  Psychiatric/Behavioral: Negative for dysphoric mood, sleep disturbance and suicidal ideas. The patient is not nervous/anxious.     Objective:  BP 108/79   Pulse 78   Temp 97.5 F (36.4 C) (Oral)   Ht  (1.727 m)   Wt 172 lb (78 kg)   SpO2 97%   BMI 26.15 kg/m   BP/Weight 02/21/2017 12/02/2016 07/21/2016  Systolic BP 108 117 103  Diastolic BP 79 72 70  Wt. (Lbs) 172 174.6 171.8  BMI 26.15 26.55 26.12    Physical Exam  Constitutional: He appears well-developed and well-nourished. No distress.  HENT:  Head: Normocephalic and atraumatic.  Neck: Normal range of motion. Neck supple.  Cardiovascular: Normal rate, regular rhythm, normal heart sounds and  intact distal pulses.   Pulmonary/Chest: Effort normal and breath sounds normal.  Musculoskeletal: He exhibits no edema.  Neurological: He is alert.  Skin: Skin is warm and dry. No rash noted. No erythema.  Psychiatric: He has a normal mood and affect.   Depression screen [facility] 2/9 02/21/2017 04/14/2016  Decreased Interest 1 2  Down, Depressed, Hopeless 1 2  PHQ - 2 Score 2 4  Altered sleeping 1 3  Tired, decreased energy 2 2  Change in appetite 0 2  Feeling bad or failure about yourself  0 1  Trouble  concentrating 1 2  Moving slowly or fidgety/restless 1 1  Suicidal thoughts 0 0  PHQ-9 Score 7 15   GAD 7 : Generalized Anxiety Score 02/21/2017 04/14/2016  Nervous, Anxious, on Edge 1 3  Control/stop worrying 1 2  Worry too much - different things 1 1  Trouble relaxing 1 1  Restless 1 1  Easily annoyed or irritable 1 1  Afraid - awful might happen 1 1  Total GAD 7 Score 7 10     Assessment & Plan:  Isaac Harding was seen today for copd.  Diagnoses and all orders for this visit:  COPD with chronic bronchitis (HCC)  Tobacco use disorder -     varenicline (CHANTIX STARTING MONTH PAK) 0.5 MG X 11 & 1 MG X 42 tablet; Taper per packet insert -     varenicline (CHANTIX CONTINUING MONTH PAK) 1 MG tablet; Take 1 tablet (1 mg total) by mouth 2 (two) times daily.  Healthcare maintenance -     Ambulatory referral to Gastroenterology  Encounter for screening for HIV -     HIV antibody (with reflex)  Need for hepatitis C screening test -     Hepatitis C Antibody   There are no diagnoses linked to this encounter.  No orders of the defined types were placed in this encounter.   Follow-up: Return in about 4 weeks (around 03/21/2017) for smoking cessation .   Dessa Phi MD

## 2017-02-21 NOTE — Assessment & Plan Note (Signed)
A: smoker with COPD P: Stop nicotrol inhaler Start chantix Cautioned regarding depression, SI and nightmares Close f/u in 4 weeks

## 2017-02-21 NOTE — Patient Instructions (Addendum)
Lyndle was seen today for copd.  Diagnoses and all orders for this visit:  COPD with chronic bronchitis (HCC)  Tobacco use disorder -     varenicline (CHANTIX STARTING MONTH PAK) 0.5 MG X 11 & 1 MG X 42 tablet; Taper per packet insert -     varenicline (CHANTIX CONTINUING MONTH PAK) 1 MG tablet; Take 1 tablet (1 mg total) by mouth 2 (two) times daily.  Healthcare maintenance -     Ambulatory referral to Gastroenterology  Encounter for screening for HIV -     HIV antibody (with reflex)  Need for hepatitis C screening test -     Hepatitis C Antibody   Start chantix: Take one 0.5 mg tablet by mouth once daily for 3 days, then increase to one 0.5 mg tablet twice daily for 4 days, then increase to one 1 mg tablet twice daily. Set quit date for 8-35 days after starting chantix.  Call if you develop depressed mood, trouble sleeping or suicidal thoughts Do not mix chantix with nicotrol inhaler  f/u in 4 weeks for smoking cessation with chantix   Dr. Armen Pickup

## 2017-02-21 NOTE — Assessment & Plan Note (Signed)
He continues to smoke 1/4 PPD with nicotrol inhaler  Stable cough and SOB Compliant with spiriva and flonase Prn albuterol

## 2017-02-22 LAB — HEPATITIS C ANTIBODY: Hep C Virus Ab: <0.1 s/co ratio (ref 0.0–0.9)

## 2017-02-22 LAB — HIV ANTIBODY (ROUTINE TESTING W REFLEX): HIV SCREEN 4TH GENERATION: NONREACTIVE

## 2017-02-23 ENCOUNTER — Telehealth: Payer: Self-pay

## 2017-02-23 NOTE — Telephone Encounter (Signed)
Pt was called and a VM was left for pt to return phone call for lab results.

## 2017-02-24 ENCOUNTER — Telehealth: Payer: Self-pay | Admitting: Family Medicine

## 2017-02-24 NOTE — Telephone Encounter (Signed)
Returning call from nurse to discuss lab results

## 2017-02-28 ENCOUNTER — Telehealth: Payer: Self-pay | Admitting: *Deleted

## 2017-02-28 NOTE — Telephone Encounter (Signed)
Pt name and DOB verified. PT aware, results are normal.

## 2017-03-02 NOTE — Telephone Encounter (Signed)
Pt was called and informed of lab results. 

## 2017-03-23 ENCOUNTER — Telehealth: Payer: Self-pay | Admitting: Family Medicine

## 2017-03-23 DIAGNOSIS — M79671 Pain in right foot: Secondary | ICD-10-CM

## 2017-03-23 NOTE — Telephone Encounter (Signed)
Will route to PCP 

## 2017-03-23 NOTE — Telephone Encounter (Signed)
Patient called the office asking to speak with PCP in regards to the pain that he is experiencing on his right heel. Pt would like an appt with a specialist (podiatry clinic).Please follow up.  Thank you.

## 2017-03-23 NOTE — Telephone Encounter (Signed)
Waiting for pcp to place the referral

## 2017-03-24 ENCOUNTER — Ambulatory Visit: Payer: Medicaid Other | Admitting: Family Medicine

## 2017-03-25 NOTE — Telephone Encounter (Signed)
Referral placed.

## 2017-03-26 ENCOUNTER — Other Ambulatory Visit: Payer: Self-pay | Admitting: Family Medicine

## 2017-03-26 DIAGNOSIS — F17200 Nicotine dependence, unspecified, uncomplicated: Secondary | ICD-10-CM

## 2017-03-28 NOTE — Telephone Encounter (Signed)
Sent Referral today 03/28/17 to [facility] . They will contact the patient   To schedule an appointment

## 2017-04-06 ENCOUNTER — Encounter: Payer: Self-pay | Admitting: Family Medicine

## 2017-04-24 ENCOUNTER — Other Ambulatory Visit: Payer: Self-pay | Admitting: Family Medicine

## 2017-04-24 DIAGNOSIS — F17200 Nicotine dependence, unspecified, uncomplicated: Secondary | ICD-10-CM

## 2017-05-05 ENCOUNTER — Encounter: Payer: Self-pay | Admitting: Podiatry

## 2017-05-05 ENCOUNTER — Ambulatory Visit (INDEPENDENT_AMBULATORY_CARE_PROVIDER_SITE_OTHER): Payer: Medicaid Other

## 2017-05-05 ENCOUNTER — Ambulatory Visit (INDEPENDENT_AMBULATORY_CARE_PROVIDER_SITE_OTHER): Payer: Medicaid Other | Admitting: Podiatry

## 2017-05-05 VITALS — BP 122/90 | HR 83

## 2017-05-05 DIAGNOSIS — M7750 Other enthesopathy of unspecified foot: Secondary | ICD-10-CM | POA: Diagnosis not present

## 2017-05-05 DIAGNOSIS — M722 Plantar fascial fibromatosis: Secondary | ICD-10-CM | POA: Diagnosis not present

## 2017-05-05 DIAGNOSIS — M205X1 Other deformities of toe(s) (acquired), right foot: Secondary | ICD-10-CM

## 2017-05-05 DIAGNOSIS — R52 Pain, unspecified: Secondary | ICD-10-CM

## 2017-05-05 MED ORDER — MELOXICAM 15 MG PO TABS: 15.0000 mg | ORAL_TABLET | Freq: Every day | ORAL | 0 refills | Status: DC

## 2017-05-05 NOTE — Progress Notes (Signed)
   Subjective:    Patient ID: Isaac Harding, male    DOB: 1956-08-07, 61 y.o.   MRN: [id number]  HPI this patient presents the office with chief complaint of a painful right foot. Patient states pain is present for the last few months and is worsening.  He says he is very active and is  responsible for his family members.  He says he has pain in the inside of his right ankle and in his heel.  He points to the area along the course of the inside ankle tendon right foot. He also relates burning extending into the right heel. He admits to walking differently due to pain in the big toe of the right foot.  He says he has tried some Gelfoam insoles but the insoles kept  moving in his shoes.  He presents the office today for definitive evaluation and treatment of his right foot pain    Review of Systems  All other systems reviewed and are negative.      Objective:   Physical Exam GENERAL APPEARANCE: Alert, conversant. Appropriately groomed. No acute distress.  VASCULAR: Pedal pulses are  [facility]  [facility].  Capillary refill time is immediate to all digits,  Normal temperature gradient.  Digital hair growth is present bilateral  NEUROLOGIC: sensation is normal to 5.07 monofilament at 5/5 sites bilateral.  Light touch is intact bilateral, Muscle strength normal.  MUSCULOSKELETAL: acceptable muscle strength, tone and stability bilateral.  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  Pain noted along the course of the posterior tibial tendon and its insertion right foot. He also has pain at the insertion of the plantar fascia right foot. He he has pain at the insertion of the Achilles tendon right foot  DERMATOLOGIC: skin color, texture, and turgor are within normal limits.  No preulcerative lesions or ulcers  are seen, no interdigital maceration noted.  No open lesions present.  Digital nails are asymptomatic. No drainage noted.         Assessment & Plan:  Tendinitis right foot/ankle  Plantar  fascitis  Hallux limitus 1st MPJ  Right foot.   IE  X-rays taken reveal no evidence of pathology right foot.  There does appear to be a elongated first metatarsal right foot. After examination of his foot was performed. It was determined that he was ambulating, holding his big toe joint off the ground  thereby leading to posterior tibial tendon tendinitis as well as fasciitis.  Patient was dispensed power step insoles. In his shoes. He was also prescribed Mobic to be taken at home. He was to return the office in 3 weeks for further evaluation and treatment   Helane GuntherGregory Jobeth Pangilinan DPM

## 2017-05-26 ENCOUNTER — Ambulatory Visit (INDEPENDENT_AMBULATORY_CARE_PROVIDER_SITE_OTHER): Payer: Medicaid Other | Admitting: Podiatry

## 2017-05-26 ENCOUNTER — Encounter: Payer: Self-pay | Admitting: Podiatry

## 2017-05-26 DIAGNOSIS — M7750 Other enthesopathy of unspecified foot: Secondary | ICD-10-CM | POA: Diagnosis not present

## 2017-05-26 DIAGNOSIS — M205X1 Other deformities of toe(s) (acquired), right foot: Secondary | ICD-10-CM

## 2017-05-26 MED ORDER — METHYLPREDNISOLONE 4 MG PO TBPK: ORAL_TABLET | ORAL | 0 refills | Status: DC

## 2017-05-26 NOTE — Progress Notes (Signed)
   Subjective:    Patient ID: Georgiann Mccoyerry L Doyon, male    DOB: 1956/07/23, 61 y.o.   MRN: [id number]  HPI this patient presents the office with chief complaint of a painful right foot.  He says that his pain has now localized to the back of the right heel.  He points to the area where the Achilles tendon inserts into the right heel.  He was treated 3 weeks ago with a prescription for Mobic  and power step insoles.  He admits that it has improved but he still does experience pain in the back of the right heel. He presents the office today for continued evaluation and treatment of his painful right foot   Review of Systems  All other systems reviewed and are negative.      Objective:   Physical Exam GENERAL APPEARANCE: Alert, conversant. Appropriately groomed. No acute distress.  VASCULAR: Pedal pulses are  palpable at  [facility].  Capillary refill time is immediate to all digits,  Normal temperature gradient.  Digital hair growth is present bilateral  NEUROLOGIC: sensation is normal to 5.07 monofilament at 5/5 sites bilateral.  Light touch is intact bilateral, Muscle strength normal.  MUSCULOSKELETAL: acceptable muscle strength, tone and stability bilateral.  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   He he has pain at the insertion of the Achilles tendon right foot.  No redness swelling or increased temperature noted.  DERMATOLOGIC: skin color, texture, and turgor are within normal limits.  No preulcerative lesions or ulcers  are seen, no interdigital maceration noted.  No open lesions present.  Digital nails are asymptomatic. No drainage noted.   Achilles tendinitis right foot.    ROV.  Patient was told to continue to wear power step insoles. He was also prescribed a Medrol Dosepak.  Following the Medrol Dosepak. He is to return to taking 60-year-old big a day.  Return to clinic 3 weeks for further evaluation and treatment   Helane GuntherGregory Inice Sanluis DPM         Assessment & Plan:   Tendinitis right foot/ankle  Plantar fascitis  Hallux limitus 1st MPJ  Right foot.   IE  X-rays taken reveal no evidence of pathology right foot.  There does appear to be a elongated first metatarsal right foot. After examination of his foot was performed. It was determined that he was ambulating, holding his big toe joint off the ground  thereby leading to posterior tibial tendon tendinitis as well as fasciitis.  Patient was dispensed power step insoles. In his shoes. He was also prescribed Mobic to be taken at home. He was to return the office in 3 weeks for further evaluation and treatment   Helane GuntherGregory Deklen Popelka DPM

## 2017-06-15 ENCOUNTER — Other Ambulatory Visit: Payer: Self-pay | Admitting: Podiatry

## 2017-06-16 ENCOUNTER — Encounter: Payer: Self-pay | Admitting: Podiatry

## 2017-06-16 ENCOUNTER — Ambulatory Visit (INDEPENDENT_AMBULATORY_CARE_PROVIDER_SITE_OTHER): Payer: Medicaid Other | Admitting: Podiatry

## 2017-06-16 DIAGNOSIS — M7661 Achilles tendinitis, right leg: Secondary | ICD-10-CM | POA: Diagnosis not present

## 2017-06-16 DIAGNOSIS — M205X1 Other deformities of toe(s) (acquired), right foot: Secondary | ICD-10-CM | POA: Diagnosis not present

## 2017-06-16 NOTE — Progress Notes (Signed)
This patient returns to the office follow-up for diagnosis of Achilles tendinitis, right foot and a retrocalcaneal bursitis.  Patient has been wearing power step insoles in his shoes.  He says he has improved a little over the last few weeks, but still says his pain is on the level of 7 out of 10.  Patient previously has been treated with both Mobic and a Medrol dosepak  . This patient was instructed to take the Medrol Dosepak and upon finishing then take Mobic until he returns to the office today.  He admitted he never took the Mobic after the dosepak.  He returns to the office today for continued evaluation and treatment.   GENERAL APPEARANCE: Alert, conversant. Appropriately groomed. No acute distress.  VASCULAR: Pedal pulses are  palpable at  [facility].  Capillary refill time is immediate to all digits,  Normal temperature gradient.  Digital hair growth is present bilateral  NEUROLOGIC: sensation is normal to 5.07 monofilament at 5/5 sites bilateral.  Light touch is intact bilateral, Muscle strength normal.  MUSCULOSKELETAL: acceptable muscle strength, tone and stability bilateral.  Intrinsic muscluature intact bilateral.  Rectus appearance of foot and digits noted bilateral. Pain elicited upon palpation at the insertion achilles tendon right foot. No evidence of redness, swelling or increased temperature is noted  DERMATOLOGIC: skin color, texture, and turgor are within normal limits.  No preulcerative lesions or ulcers  are seen, no interdigital maceration noted.  No open lesions present.  Digital nails are asymptomatic. No drainage noted.  Diagnosis  Achilles tendinitis, right foot.  Retrocalcaneal bursitis, right heel.  Treatment  ROV  patient is having difficulty healing which may be due to him still being active despite taking the medication.  Therefore, I have placed this patient in a new Unna boot and dispensed a surgical shoe to be worn for week.  He is to return to the office in  one week for Unna boot removal and repeat evaluation of his bursitis.   Helane GuntherGregory Jesly Hartmann DPM   .  .

## 2017-06-23 ENCOUNTER — Ambulatory Visit: Payer: Medicaid Other | Admitting: Podiatry

## 2017-07-12 ENCOUNTER — Ambulatory Visit: Payer: Medicaid Other | Admitting: Podiatry

## 2017-07-15 ENCOUNTER — Ambulatory Visit: Payer: Medicaid Other | Attending: Family Medicine | Admitting: Family Medicine

## 2017-07-15 ENCOUNTER — Telehealth: Payer: Self-pay | Admitting: Pharmacist

## 2017-07-15 ENCOUNTER — Encounter: Payer: Self-pay | Admitting: Family Medicine

## 2017-07-15 VITALS — BP 116/78 | HR 72 | Temp 98.2°F | Resp 18 | Ht 68.0 in | Wt 170.0 lb

## 2017-07-15 DIAGNOSIS — Z79899 Other long term (current) drug therapy: Secondary | ICD-10-CM | POA: Insufficient documentation

## 2017-07-15 DIAGNOSIS — H60501 Unspecified acute noninfective otitis externa, right ear: Secondary | ICD-10-CM | POA: Insufficient documentation

## 2017-07-15 DIAGNOSIS — F329 Major depressive disorder, single episode, unspecified: Secondary | ICD-10-CM | POA: Insufficient documentation

## 2017-07-15 DIAGNOSIS — F17210 Nicotine dependence, cigarettes, uncomplicated: Secondary | ICD-10-CM | POA: Insufficient documentation

## 2017-07-15 DIAGNOSIS — F17200 Nicotine dependence, unspecified, uncomplicated: Secondary | ICD-10-CM

## 2017-07-15 DIAGNOSIS — J449 Chronic obstructive pulmonary disease, unspecified: Secondary | ICD-10-CM | POA: Insufficient documentation

## 2017-07-15 DIAGNOSIS — Z8659 Personal history of other mental and behavioral disorders: Secondary | ICD-10-CM

## 2017-07-15 DIAGNOSIS — Z1211 Encounter for screening for malignant neoplasm of colon: Secondary | ICD-10-CM

## 2017-07-15 MED ORDER — TIOTROPIUM BROMIDE MONOHYDRATE 18 MCG IN CAPS: 18.0000 ug | ORAL_CAPSULE | Freq: Every day | RESPIRATORY_TRACT | 4 refills | Status: DC

## 2017-07-15 MED ORDER — OFLOXACIN 0.3 % OT SOLN: 10.0000 [drp] | Freq: Every day | OTIC | 0 refills | Status: DC

## 2017-07-15 MED ORDER — ALBUTEROL SULFATE HFA 108 (90 BASE) MCG/ACT IN AERS: 1.0000 | INHALATION_SPRAY | Freq: Four times a day (QID) | RESPIRATORY_TRACT | 4 refills | Status: DC | PRN

## 2017-07-15 NOTE — Patient Instructions (Signed)

## 2017-07-15 NOTE — Progress Notes (Signed)
Patient is here for med refill   Patient complains right ear pain

## 2017-07-15 NOTE — Telephone Encounter (Signed)
Ofloxacin not covered by insurance - Ciprodex or neomycin-polymyxin-hydrocortisone preferred.

## 2017-07-15 NOTE — Progress Notes (Signed)
Subjective:  Patient ID: Isaac Harding, male    DOB: Feb 11, 1956  Age: 61 y.o. MRN: [id number]  CC: Follow-up   HPI Isaac Harding presents to establish care. History of COPD. He denies  dyspnea, cough, wheezing, chills and fever.  Patient currently is not on home oxygen therapy.Marland Kitchen Respiratory history: chronic bronchitis. He is a current smoker he reports smoking 1 pack per day since 62 y.o. He reports adherence with inhaler medications. He complaints of right ear pain,itching, and muffled hearing. Symptoms began several weeks ago. He denies any fevers, drainage, or trauma to the ear. He denies taking anything for pain. History of depression/anxiety. He denies any SI/HI. He reports taking Celexa for symptoms.   Outpatient Medications Prior to Visit  Medication Sig Dispense Refill  . acetaminophen (TYLENOL) 500 MG tablet Take 500 mg by mouth every 6 (six) hours as needed for headache.    . CHANTIX STARTING MONTH PAK 0.5 MG X 11 & 1 MG X 42 tablet TAKE AS DIRECTED 53 tablet 0  . citalopram (CELEXA) 10 MG tablet Take 30 mg by mouth every morning.  2  . fluticasone (FLONASE) 50 MCG/ACT nasal spray Place 2 sprays into both nostrils daily. 16 g 6  . varenicline (CHANTIX CONTINUING MONTH PAK) 1 MG tablet Take 1 tablet (1 mg total) by mouth 2 (two) times daily. 60 tablet 1  . albuterol (PROVENTIL HFA;VENTOLIN HFA) 108 (90 Base) MCG/ACT inhaler Inhale 1-2 puffs into the lungs every 6 (six) hours as needed for wheezing or shortness of breath. 1 Inhaler 4  . guaiFENesin (MUCINEX) 600 MG 12 hr tablet Take 1 tablet (600 mg total) by mouth 2 (two) times daily as needed. 30 tablet 0  . meloxicam (MOBIC) 15 MG tablet Take 1 tablet (15 mg total) by mouth daily. 30 tablet 0  . methylPREDNISolone (MEDROL DOSEPAK) 4 MG TBPK tablet As directed 30 tablet 0  . tiotropium (SPIRIVA HANDIHALER) 18 MCG inhalation capsule Place 1 capsule (18 mcg total) into inhaler and inhale daily. 30 capsule 6   No  facility-administered medications prior to visit.     ROS Review of Systems  Constitutional: Negative.   HENT: Positive for ear pain and hearing loss.   Eyes: Negative.   Respiratory: Negative.   Cardiovascular: Negative.   Neurological: Negative for dizziness.  Psychiatric/Behavioral: Positive for dysphoric mood. Negative for suicidal ideas.       Objective:  BP 116/78   Pulse 72   Temp 98.2 F (36.8 C) (Oral)   Resp 18   Ht 5\' 8"  (1.727 m)   Wt 170 lb (77.1 kg)   SpO2 96%   BMI 25.85 kg/m   BP/Weight 07/15/2017 05/05/2017 02/21/2017  Systolic BP 116 122 108  Diastolic BP 78 90 79  Wt. (Lbs) 170 - 172  BMI 25.85 - 26.15     Physical Exam  Constitutional: He appears well-developed and well-nourished.  HENT:  Head: Normocephalic and atraumatic.  Right Ear: There is tenderness. Tympanic membrane is not injected and not bulging. Decreased hearing (muffled) is noted.  Left Ear: Tympanic membrane is not injected and not bulging.  Mouth/Throat: Oropharynx is clear and moist.  Eyes: Pupils are equal, round, and reactive to light. Conjunctivae are normal.  Neck: No JVD present.  Cardiovascular: Normal rate, regular rhythm, normal heart sounds and intact distal pulses.   Pulmonary/Chest: Effort normal and breath sounds normal. He has no wheezes.  Abdominal: Soft. Bowel sounds are normal.  Skin: Skin is warm  and dry.  Psychiatric: He expresses no homicidal and no suicidal ideation. He expresses no suicidal plans and no homicidal plans.  Nursing note and vitals reviewed.   Depression screen [facility] 2/9 07/15/2017 02/21/2017 04/14/2016  Decreased Interest 2 1 2   Down, Depressed, Hopeless 1 1 2   PHQ - 2 Score 3 2 4   Altered sleeping 1 1 3   Tired, decreased energy 1 2 2   Change in appetite 1 0 2  Feeling bad or failure about yourself  0 0 1  Trouble concentrating 1 1 2   Moving slowly or fidgety/restless 1 1 1   Suicidal thoughts 0 0 0  PHQ-9 Score 8 7 15    GAD 7 : Generalized  Anxiety Score 07/15/2017 02/21/2017 04/14/2016  Nervous, Anxious, on Edge 2 1 3   Control/stop worrying 1 1 2   Worry too much - different things 1 1 1   Trouble relaxing 2 1 1   Restless 1 1 1   Easily annoyed or irritable 1 1 1   Afraid - awful might happen 1 1 1   Total GAD 7 Score 9 7 10       Assessment & Plan:   Problem List Items Addressed This Visit      Respiratory   COPD with chronic bronchitis (HCC) - Primary   Relevant Medications   albuterol (PROVENTIL HFA;VENTOLIN HFA) 108 (90 Base) MCG/ACT inhaler   tiotropium (SPIRIVA HANDIHALER) 18 MCG inhalation capsule    Other Visit Diagnoses    Acute otitis externa of right ear, unspecified type       Relevant Medications         ofloxacin (FLOXIN) 0.3 % OTIC solution   History of depression       Current smoker on some days       Colon cancer screening       Relevant Orders   Ambulatory referral to Gastroenterology        Follow-up: Return in about 4 months (around 11/14/2017) for COPD.   Lizbeth Bark FNP

## 2017-07-18 ENCOUNTER — Other Ambulatory Visit: Payer: Self-pay | Admitting: Family Medicine

## 2017-07-18 ENCOUNTER — Telehealth: Payer: Self-pay

## 2017-07-18 DIAGNOSIS — H60501 Unspecified acute noninfective otitis externa, right ear: Secondary | ICD-10-CM

## 2017-07-18 MED ORDER — CIPROFLOXACIN-DEXAMETHASONE 0.3-0.1 % OT SUSP: 4.0000 [drp] | Freq: Two times a day (BID) | OTIC | 0 refills | Status: DC

## 2017-07-18 MED FILL — CIPRODEX OTIC SUSPENSION: 0.3-0.1 | 14 days supply | Qty: 8 | Fill #0

## 2017-07-18 NOTE — Telephone Encounter (Signed)
CMA call regarding

## 2017-07-18 NOTE — Telephone Encounter (Signed)
Ciprodex prescription sent to pharmacy.

## 2017-07-27 ENCOUNTER — Ambulatory Visit: Payer: Medicaid Other | Admitting: Podiatry

## 2017-08-02 ENCOUNTER — Encounter: Payer: Self-pay | Admitting: Podiatry

## 2017-08-02 ENCOUNTER — Ambulatory Visit (INDEPENDENT_AMBULATORY_CARE_PROVIDER_SITE_OTHER): Payer: Medicaid Other | Admitting: Podiatry

## 2017-08-02 DIAGNOSIS — M7750 Other enthesopathy of unspecified foot: Secondary | ICD-10-CM

## 2017-08-02 DIAGNOSIS — M7661 Achilles tendinitis, right leg: Secondary | ICD-10-CM

## 2017-08-02 MED ORDER — MELOXICAM 15 MG PO TABS: 15.0000 mg | ORAL_TABLET | Freq: Every day | ORAL | 0 refills | Status: DC

## 2017-08-02 NOTE — Progress Notes (Signed)
This patient returns to the office follow-up for diagnosis of Achilles tendinitis, right foot and a retrocalcaneal bursitis.    Patient previously has been treated with both Mobic and a Medrol dosepak  . Marland Kitchen.  He says that the [employer]na boot that was applied at the last visit was hurting and therefore he personally took off the [employer] AirlinesUnna boots.  Since that time he has had multiple family emergencies including finding his father with dementia who was walking on the streets.  He says the painful area in the back of his right foot is basically pain-free at this time.   He returns to the office today for continued evaluation and treatment.   GENERAL APPEARANCE: Alert, conversant. Appropriately groomed. No acute distress.  VASCULAR: Pedal pulses are  palpable at  [facility].  Capillary refill time is immediate to all digits,  Normal temperature gradient.  Digital hair growth is present bilateral  NEUROLOGIC: sensation is normal to 5.07 monofilament at 5/5 sites bilateral.  Light touch is intact bilateral, Muscle strength normal.  MUSCULOSKELETAL: acceptable muscle strength, tone and stability bilateral.  Intrinsic muscluature intact bilateral.  Rectus appearance of foot and digits noted bilateral. No palpable pain noted at the insertion of the Achilles tendon right foot while retrocalcaneally. No evidence of redness, swelling or increased temperature is noted  DERMATOLOGIC: skin color, texture, and turgor are within normal limits.  No preulcerative lesions or ulcers  are seen, no interdigital maceration noted.  No open lesions present.  Digital nails are asymptomatic. No drainage noted.  Diagnosis  Healing achilles tendinitis right foot.  Treatment  ROV  patient states that he is doing much better and presents to the office for an evaluation.  After my evaluation. I recommended that we prescribed Mobic for him to take when necessary.  Return to the clinic when necessary.  Patient Achilles tendinitis has resolved  and I am pleased with this progress.   Helane GuntherGregory Keaisha Harding DPM   .  .

## 2017-08-17 ENCOUNTER — Other Ambulatory Visit: Payer: Self-pay | Admitting: Family Medicine

## 2017-08-28 ENCOUNTER — Other Ambulatory Visit: Payer: Self-pay | Admitting: Podiatry

## 2017-09-26 ENCOUNTER — Encounter: Payer: Self-pay | Admitting: Family Medicine

## 2017-09-29 ENCOUNTER — Other Ambulatory Visit: Payer: Self-pay | Admitting: Podiatry

## 2017-10-20 ENCOUNTER — Other Ambulatory Visit: Payer: Self-pay | Admitting: Pharmacist

## 2017-10-20 DIAGNOSIS — J449 Chronic obstructive pulmonary disease, unspecified: Secondary | ICD-10-CM

## 2017-10-20 MED ORDER — ALBUTEROL SULFATE HFA 108 (90 BASE) MCG/ACT IN AERS: 1.0000 | INHALATION_SPRAY | Freq: Four times a day (QID) | RESPIRATORY_TRACT | 1 refills | Status: DC | PRN

## 2017-11-17 ENCOUNTER — Ambulatory Visit: Payer: Medicaid Other | Attending: Family Medicine | Admitting: Family Medicine

## 2017-11-17 ENCOUNTER — Ambulatory Visit: Payer: Medicaid Other | Admitting: Family Medicine

## 2017-11-17 ENCOUNTER — Encounter: Payer: Self-pay | Admitting: Family Medicine

## 2017-11-17 VITALS — BP 112/74 | HR 85 | Temp 98.9°F | Resp 18 | Ht 68.0 in | Wt 166.8 lb

## 2017-11-17 DIAGNOSIS — Z1211 Encounter for screening for malignant neoplasm of colon: Secondary | ICD-10-CM | POA: Diagnosis not present

## 2017-11-17 DIAGNOSIS — J3089 Other allergic rhinitis: Secondary | ICD-10-CM | POA: Insufficient documentation

## 2017-11-17 DIAGNOSIS — F17200 Nicotine dependence, unspecified, uncomplicated: Secondary | ICD-10-CM | POA: Diagnosis not present

## 2017-11-17 DIAGNOSIS — Z79899 Other long term (current) drug therapy: Secondary | ICD-10-CM | POA: Diagnosis not present

## 2017-11-17 DIAGNOSIS — J449 Chronic obstructive pulmonary disease, unspecified: Secondary | ICD-10-CM | POA: Diagnosis not present

## 2017-11-17 DIAGNOSIS — Z8659 Personal history of other mental and behavioral disorders: Secondary | ICD-10-CM | POA: Insufficient documentation

## 2017-11-17 MED ORDER — BUDESONIDE-FORMOTEROL FUMARATE 80-4.5 MCG/ACT IN AERO: 2.0000 | INHALATION_SPRAY | Freq: Two times a day (BID) | RESPIRATORY_TRACT | 3 refills | Status: DC

## 2017-11-17 MED ORDER — ALBUTEROL SULFATE HFA 108 (90 BASE) MCG/ACT IN AERS: 1.0000 | INHALATION_SPRAY | Freq: Four times a day (QID) | RESPIRATORY_TRACT | 6 refills | Status: DC | PRN

## 2017-11-17 MED ORDER — FLUTICASONE PROPIONATE 50 MCG/ACT NA SUSP: 1.0000 | Freq: Every day | NASAL | 2 refills | Status: DC | PRN

## 2017-11-17 MED ORDER — LORATADINE 10 MG PO TABS: 10.0000 mg | ORAL_TABLET | Freq: Every day | ORAL | 2 refills | Status: DC

## 2017-11-17 MED ORDER — TIOTROPIUM BROMIDE MONOHYDRATE 18 MCG IN CAPS: 18.0000 ug | ORAL_CAPSULE | Freq: Every day | RESPIRATORY_TRACT | 11 refills | Status: DC

## 2017-11-17 NOTE — Progress Notes (Signed)
Subjective:  Patient ID: Isaac Harding, male    DOB: 1956/01/09  Age: 61 y.o. MRN: [id number]  CC: Follow-up   HPI Isaac Harding presents to establish care. History of COPD. He denies  dyspnea, cough, wheezing, chills and fever.  Patient currently is not on home oxygen therapy.Marland Kitchen. Respiratory history: chronic bronchitis. He is a current smoker he reports having medications to help him quit.  He reports adherence with inhaler medications. He reports using albuterol inhaler QD.  He denies any fevers, drainage He does report rhinorrhea with cough. History of depression/anxiety. He receives mental health services from [facility]. He denies any SI/HI. He reports taking Celexa for symptoms with recent increase to 40 mg QD.   Outpatient Medications Prior to Visit  Medication Sig Dispense Refill  . acetaminophen (TYLENOL) 500 MG tablet Take 500 mg by mouth every 6 (six) hours as needed for headache.    . CHANTIX STARTING MONTH PAK 0.5 MG X 11 & 1 MG X 42 tablet TAKE AS DIRECTED 53 tablet 0  . citalopram (CELEXA) 10 MG tablet Take 30 mg by mouth every morning.  2  . meloxicam (MOBIC) 15 MG tablet TAKE 1 TABLET BY MOUTH EVERY DAY 30 tablet 2  . varenicline (CHANTIX CONTINUING MONTH PAK) 1 MG tablet Take 1 tablet (1 mg total) by mouth 2 (two) times daily. 60 tablet 1  . albuterol (PROVENTIL HFA;VENTOLIN HFA) 108 (90 Base) MCG/ACT inhaler Inhale 1-2 puffs into the lungs every 6 (six) hours as needed for wheezing or shortness of breath. 1 Inhaler 1  . fluticasone (FLONASE) 50 MCG/ACT nasal spray PLACE 2 SPRAYS INTO BOTH NOSTRILS DAILY. 16 g 2  . tiotropium (SPIRIVA HANDIHALER) 18 MCG inhalation capsule Place 1 capsule (18 mcg total) into inhaler and inhale daily. 30 capsule 4  . ciprofloxacin-dexamethasone (CIPRODEX) OTIC suspension Place 4 drops into the right ear 2 (two) times daily. (Patient not taking: Reported on 11/17/2017) 7.5 mL 0   No facility-administered medications prior to visit.      ROS Review of Systems  Constitutional: Negative.   HENT: Positive for rhinorrhea.   Eyes: Negative.   Respiratory: Positive for cough.   Cardiovascular: Negative.   Neurological: Negative for dizziness.  Psychiatric/Behavioral: Negative for suicidal ideas. Dysphoric mood: history of depression.    Objective:  BP 112/74 (BP Location: Left Arm, Patient Position: Sitting, Cuff Size: Normal)   Pulse 85   Temp 98.9 F (37.2 C) (Oral)   Resp 18   Ht 5\' 8"  (1.727 m)   Wt 166 lb 12.8 oz (75.7 kg)   SpO2 96%   BMI 25.36 kg/m   BP/Weight 11/17/2017 07/15/2017 05/05/2017  Systolic BP 112 116 122  Diastolic BP 74 78 90  Wt. (Lbs) 166.8 170 -  BMI 25.36 25.85 -    Physical Exam  Constitutional: He appears well-developed and well-nourished.  HENT:  Head: Normocephalic and atraumatic.  Right Ear: Tympanic membrane is not injected and not bulging.  Left Ear: Tympanic membrane is not injected and not bulging.  Mouth/Throat: Oropharynx is clear and moist.  Eyes: Conjunctivae are normal. Right eye exhibits no discharge. Left eye exhibits no discharge.  Cardiovascular: Normal rate, regular rhythm, normal heart sounds and intact distal pulses.  Pulmonary/Chest: Effort normal and breath sounds normal. He has no wheezes. He has no rhonchi.  Skin: Skin is warm and dry.  Psychiatric: He expresses no homicidal and no suicidal ideation. He expresses no suicidal plans and no homicidal plans.  Nursing  note and vitals reviewed.   [facility] 11/17/2017 07/15/2017 02/21/2017  Decreased Interest 1 2 1   Down, Depressed, Hopeless 1 1 1   PHQ - 2 Score 2 3 2   Altered sleeping 1 1 1   Tired, decreased energy 1 1 2   Change in appetite 1 1 0  Feeling bad or failure about yourself  0 0 0  Trouble concentrating 0 1 1  Moving slowly or fidgety/restless 0 1 1  Suicidal thoughts 0 0 0  PHQ-9 Score 5 8 7    GAD 7 : Generalized Anxiety Score 11/17/2017 07/15/2017 02/21/2017 04/14/2016  Nervous,  Anxious, on Edge 1 2 1 3   Control/stop worrying 1 1 1 2   Worry too much - different things 0 1 1 1   Trouble relaxing 1 2 1 1   Restless 0 1 1 1   Easily annoyed or irritable 1 1 1 1   Afraid - awful might happen 0 1 1 1   Total GAD 7 Score 4 9 7 10       Assessment & Plan:   1. COPD with chronic bronchitis (HCC) Symbicort added for better COPD control. - albuterol (PROVENTIL HFA;VENTOLIN HFA) 108 (90 Base) MCG/ACT inhaler; Inhale 1-2 puffs into the lungs every 6 (six) hours as needed for wheezing or shortness of breath.  Dispense: 1 Inhaler; Refill: 6 - tiotropium (SPIRIVA HANDIHALER) 18 MCG inhalation capsule; Place 1 capsule (18 mcg total) into inhaler and inhale daily.  Dispense: 30 capsule; Refill: 11 - budesonide-formoterol (SYMBICORT) 80-4.5 MCG/ACT inhaler; Inhale 2 puffs into the lungs 2 (two) times daily.  Dispense: 1 Inhaler; Refill: 3  2. Non-seasonal allergic rhinitis, unspecified trigger  - fluticasone (FLONASE) 50 MCG/ACT nasal spray; Place 1 spray into both nostrils daily as needed for allergies or rhinitis.  Dispense: 16 g; Refill: 2 - loratadine (CLARITIN) 10 MG tablet; Take 1 tablet (10 mg total) by mouth daily.  Dispense: 30 tablet; Refill: 2  3. History of depression  He receives [facility] health [facility] from [facility].   4. Current every day smoker Encouraged smoking cessation. Patient has resources to help him quit.  5. Screen for colon cancer  - Fecal occult blood, imunochemical     Follow-up: Return in about 8 weeks (around 01/12/2018), or if symptoms worsen or fail to improve, for COPD .   Lizbeth BarkMandesia R Jenine Krisher FNP

## 2017-11-17 NOTE — Patient Instructions (Signed)

## 2017-11-17 NOTE — Progress Notes (Signed)
Patient is here for a follow up.

## 2017-11-18 ENCOUNTER — Encounter: Payer: Self-pay | Admitting: Pharmacist

## 2017-11-18 NOTE — Progress Notes (Signed)
PA submitted for Symbicort. Approved #[id number]

## 2017-11-23 ENCOUNTER — Other Ambulatory Visit: Payer: Self-pay | Admitting: Family Medicine

## 2017-11-25 LAB — FECAL OCCULT BLOOD, IMMUNOCHEMICAL: Fecal Occult Bld: NEGATIVE

## 2017-11-29 ENCOUNTER — Telehealth (INDEPENDENT_AMBULATORY_CARE_PROVIDER_SITE_OTHER): Payer: Self-pay | Admitting: *Deleted

## 2017-11-29 NOTE — Telephone Encounter (Signed)
-----   Message from Lizbeth BarkMandesia R Hairston, FNP sent at 11/28/2017  1:17 PM EST ----- Negative colon cancer screening. Repeat in 1 year.

## 2017-11-29 NOTE — Telephone Encounter (Signed)
Patient verified DOB Patient is aware of colon screening being negative and a repeat being completed in one year. No further questions.

## 2017-11-30 IMAGING — DX DG CHEST 2V
2 series · 2 of 2 positions shown · non-contrast
Comparison: None.

CLINICAL DATA: Shortness of breath, panic attack 4 days ago, smoker

EXAM:
CHEST  2 VIEW

[w chest pa]
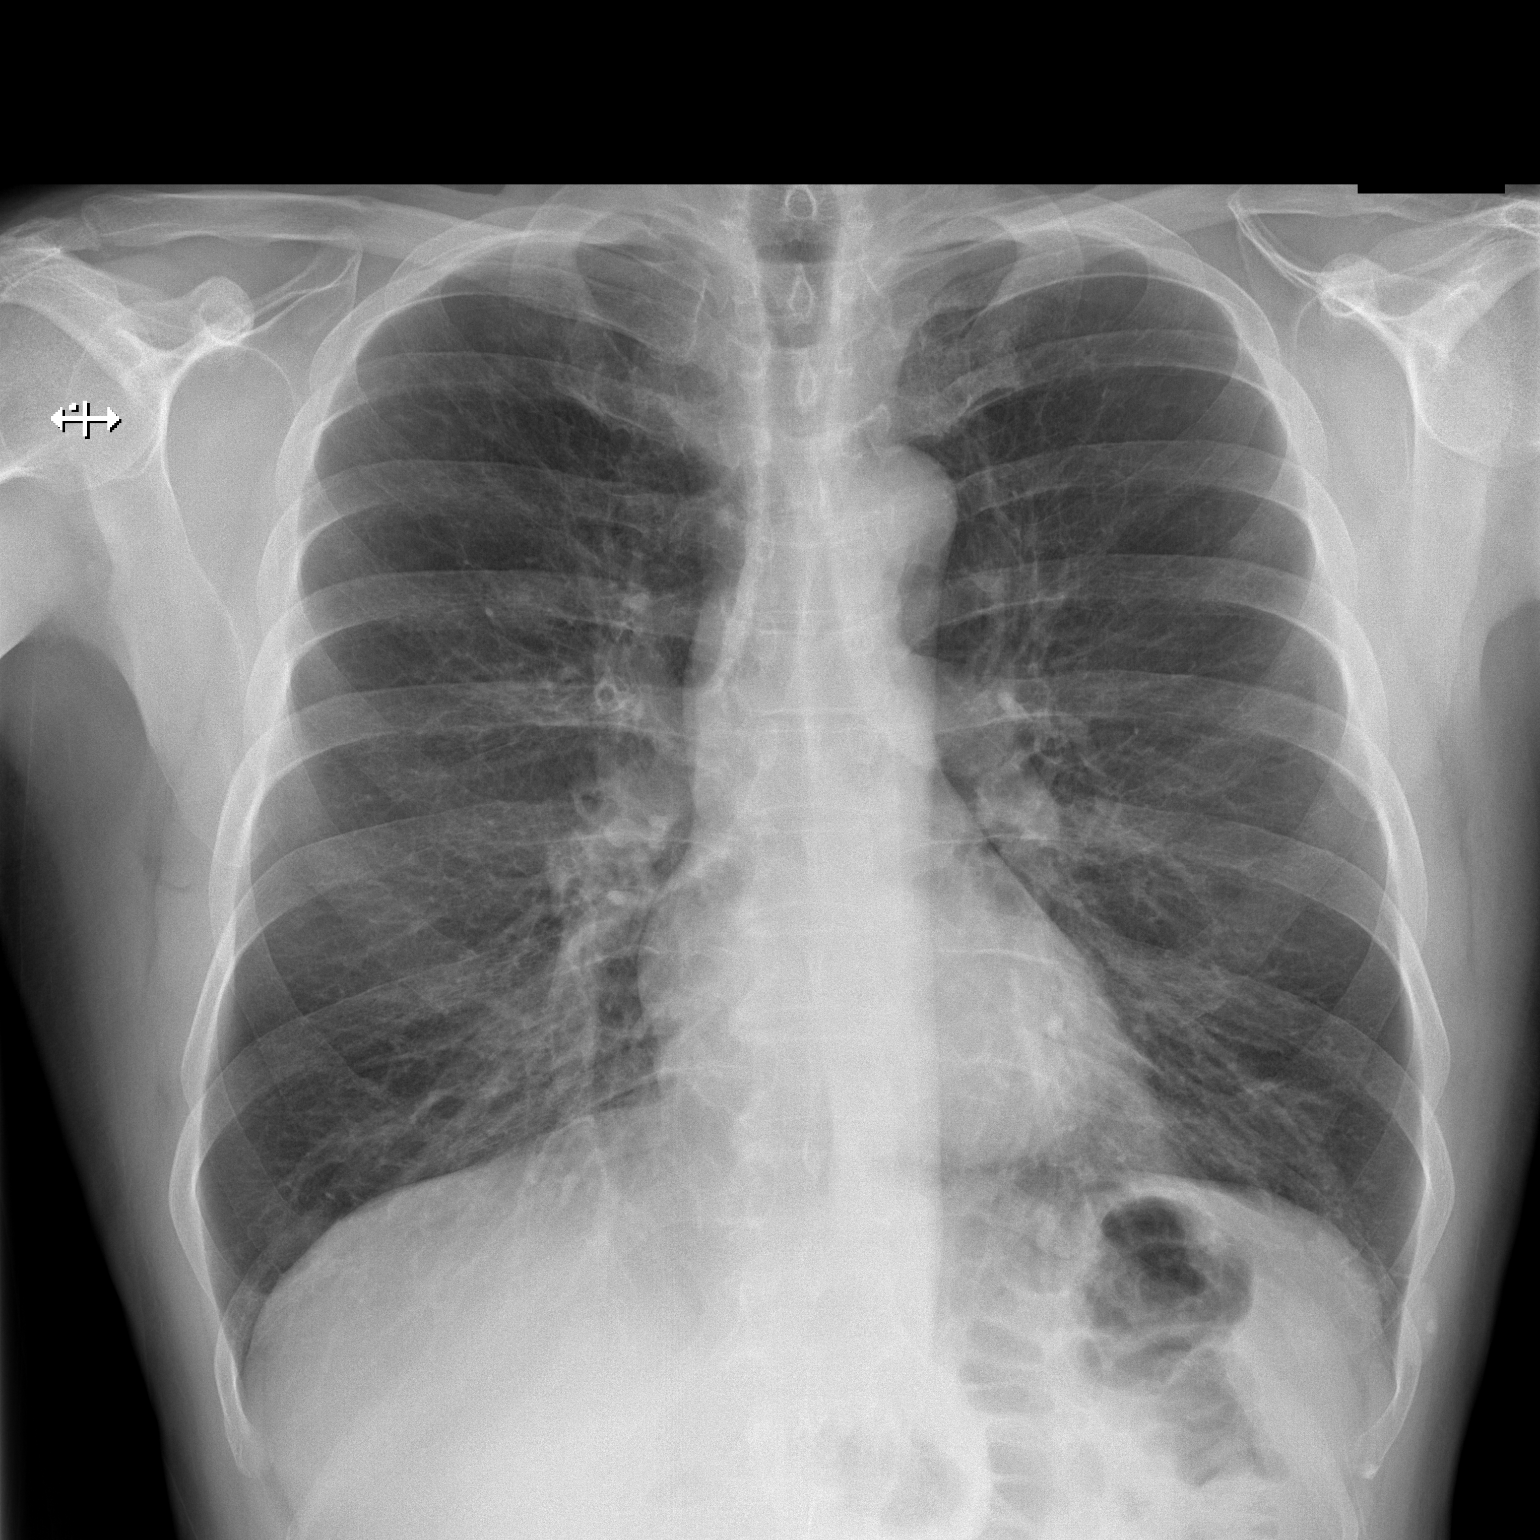

[w chest lat]
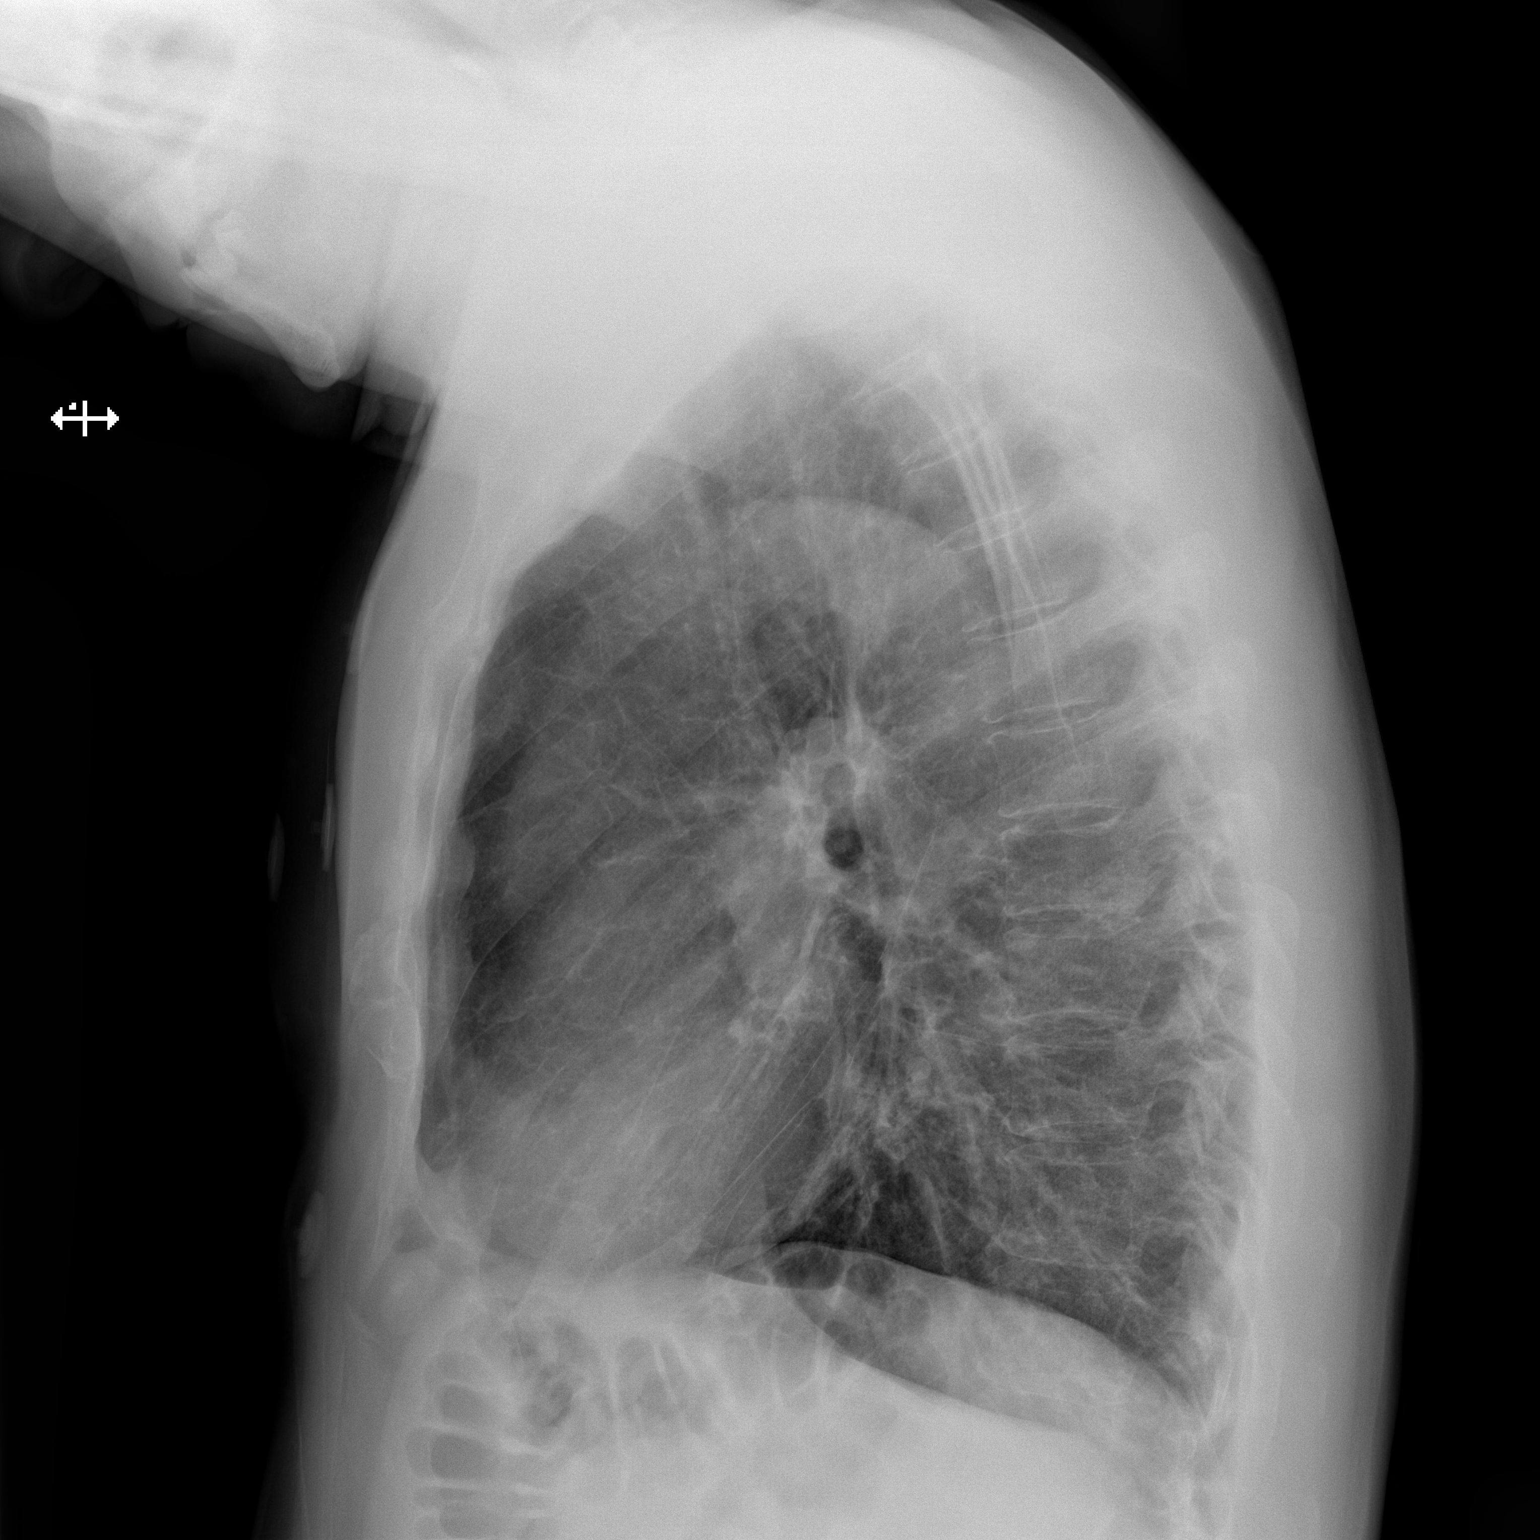

[2 of 2 positions shown; findings below may reference images not displayed]

FINDINGS: Cardiomediastinal silhouette is unremarkable. No acute infiltrate or
pleural effusion. No pulmonary edema pre mild perihilar bronchitic
changes. Mild hyperinflation. Bony thorax is unremarkable.
IMPRESSION: No infiltrate or pulmonary edema. Mild perihilar bronchitic changes.
Mild hyperinflation.

## 2017-12-08 ENCOUNTER — Encounter: Payer: Self-pay | Admitting: Pharmacist

## 2017-12-08 NOTE — Progress Notes (Signed)
PA submitted for Symbicort. Pending review by [place] [employer]. Confirmation #[id number]

## 2018-01-11 ENCOUNTER — Ambulatory Visit: Payer: Medicaid Other

## 2018-02-03 ENCOUNTER — Ambulatory Visit: Payer: Medicaid Other | Admitting: Nurse Practitioner

## 2018-02-22 ENCOUNTER — Other Ambulatory Visit: Payer: Self-pay | Admitting: Pharmacist

## 2018-02-22 DIAGNOSIS — J3089 Other allergic rhinitis: Secondary | ICD-10-CM

## 2018-02-22 MED ORDER — FLUTICASONE PROPIONATE 50 MCG/ACT NA SUSP: 1.0000 | Freq: Every day | NASAL | 0 refills | Status: DC | PRN

## 2018-02-22 MED ORDER — LORATADINE 10 MG PO TABS: 10.0000 mg | ORAL_TABLET | Freq: Every day | ORAL | 0 refills | Status: DC

## 2018-03-02 ENCOUNTER — Ambulatory Visit: Payer: Medicaid Other | Admitting: Nurse Practitioner

## 2018-03-15 ENCOUNTER — Encounter: Payer: Self-pay | Admitting: Critical Care Medicine

## 2018-03-15 ENCOUNTER — Ambulatory Visit: Payer: Medicaid Other | Attending: Nurse Practitioner | Admitting: Critical Care Medicine

## 2018-03-15 VITALS — BP 112/75 | HR 73 | Temp 97.8°F | Resp 18 | Ht 68.0 in | Wt 164.6 lb

## 2018-03-15 DIAGNOSIS — F17200 Nicotine dependence, unspecified, uncomplicated: Secondary | ICD-10-CM

## 2018-03-15 DIAGNOSIS — Z880 Allergy status to penicillin: Secondary | ICD-10-CM | POA: Insufficient documentation

## 2018-03-15 DIAGNOSIS — F329 Major depressive disorder, single episode, unspecified: Secondary | ICD-10-CM | POA: Diagnosis not present

## 2018-03-15 DIAGNOSIS — Z79899 Other long term (current) drug therapy: Secondary | ICD-10-CM | POA: Diagnosis not present

## 2018-03-15 DIAGNOSIS — J3089 Other allergic rhinitis: Secondary | ICD-10-CM

## 2018-03-15 DIAGNOSIS — Z7951 Long term (current) use of inhaled steroids: Secondary | ICD-10-CM | POA: Diagnosis not present

## 2018-03-15 DIAGNOSIS — F17210 Nicotine dependence, cigarettes, uncomplicated: Secondary | ICD-10-CM | POA: Insufficient documentation

## 2018-03-15 DIAGNOSIS — J449 Chronic obstructive pulmonary disease, unspecified: Secondary | ICD-10-CM | POA: Diagnosis not present

## 2018-03-15 DIAGNOSIS — Z803 Family history of malignant neoplasm of breast: Secondary | ICD-10-CM | POA: Insufficient documentation

## 2018-03-15 MED ORDER — BUDESONIDE-FORMOTEROL FUMARATE 80-4.5 MCG/ACT IN AERO: 2.0000 | INHALATION_SPRAY | Freq: Two times a day (BID) | RESPIRATORY_TRACT | 3 refills | Status: DC

## 2018-03-15 MED ORDER — LORATADINE 10 MG PO TABS: 10.0000 mg | ORAL_TABLET | Freq: Every day | ORAL | 6 refills | Status: DC

## 2018-03-15 MED ORDER — ALBUTEROL SULFATE HFA 108 (90 BASE) MCG/ACT IN AERS: 1.0000 | INHALATION_SPRAY | Freq: Four times a day (QID) | RESPIRATORY_TRACT | 6 refills | Status: DC | PRN

## 2018-03-15 MED ORDER — TIOTROPIUM BROMIDE MONOHYDRATE 18 MCG IN CAPS: 18.0000 ug | ORAL_CAPSULE | Freq: Every day | RESPIRATORY_TRACT | 11 refills | Status: DC

## 2018-03-15 MED ORDER — FLUTICASONE PROPIONATE 50 MCG/ACT NA SUSP: 1.0000 | Freq: Every day | NASAL | 6 refills | Status: DC | PRN
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
## 2018-03-15 NOTE — Assessment & Plan Note (Signed)
Tobacco use Recommend nicotine lozenge 4mg  qid 5 min smoking cessation counseling given

## 2018-03-15 NOTE — Patient Instructions (Signed)
[facility] on [facility] and clariten [facility] Focus on stopping smoking, use nicotine lozenge  4mg  four times per day  Return 6 months

## 2018-03-15 NOTE — Assessment & Plan Note (Signed)
Copd with bronchitis, ongoing tobacco use HFA technique needs to be optimized Plan Cont symbicort but increase to two puff bid Cont spiriva daily Prn saba Tobacco cessation

## 2018-06-02 DIAGNOSIS — F329 Major depressive disorder, single episode, unspecified: Secondary | ICD-10-CM | POA: Diagnosis not present

## 2018-08-16 DIAGNOSIS — Z23 Encounter for immunization: Secondary | ICD-10-CM | POA: Diagnosis not present

## 2018-09-28 ENCOUNTER — Ambulatory Visit: Payer: Medicaid Other | Attending: Family Medicine | Admitting: Family Medicine

## 2018-09-28 VITALS — BP 109/74 | HR 89 | Temp 98.0°F | Ht 68.0 in

## 2018-09-28 DIAGNOSIS — F17200 Nicotine dependence, unspecified, uncomplicated: Secondary | ICD-10-CM

## 2018-09-28 DIAGNOSIS — F17210 Nicotine dependence, cigarettes, uncomplicated: Secondary | ICD-10-CM | POA: Diagnosis not present

## 2018-09-28 DIAGNOSIS — J309 Allergic rhinitis, unspecified: Secondary | ICD-10-CM | POA: Diagnosis not present

## 2018-09-28 DIAGNOSIS — Z800 Family history of malignant neoplasm of digestive organs: Secondary | ICD-10-CM | POA: Diagnosis not present

## 2018-09-28 DIAGNOSIS — L989 Disorder of the skin and subcutaneous tissue, unspecified: Secondary | ICD-10-CM

## 2018-09-28 DIAGNOSIS — F4321 Adjustment disorder with depressed mood: Secondary | ICD-10-CM

## 2018-09-28 DIAGNOSIS — Z79899 Other long term (current) drug therapy: Secondary | ICD-10-CM | POA: Diagnosis not present

## 2018-09-28 DIAGNOSIS — J449 Chronic obstructive pulmonary disease, unspecified: Secondary | ICD-10-CM | POA: Diagnosis not present

## 2018-09-28 DIAGNOSIS — D696 Thrombocytopenia, unspecified: Secondary | ICD-10-CM | POA: Diagnosis not present

## 2018-09-28 DIAGNOSIS — J4489 Other specified chronic obstructive pulmonary disease: Secondary | ICD-10-CM

## 2018-09-28 MED ORDER — BUDESONIDE-FORMOTEROL FUMARATE 80-4.5 MCG/ACT IN AERO: 2.0000 | INHALATION_SPRAY | Freq: Two times a day (BID) | RESPIRATORY_TRACT | 3 refills | Status: DC

## 2018-09-28 MED ORDER — LORATADINE 10 MG PO TABS: 10.0000 mg | ORAL_TABLET | Freq: Every day | ORAL | 6 refills | Status: DC

## 2018-09-28 MED ORDER — ALBUTEROL SULFATE HFA 108 (90 BASE) MCG/ACT IN AERS: 1.0000 | INHALATION_SPRAY | Freq: Four times a day (QID) | RESPIRATORY_TRACT | 6 refills | Status: DC | PRN

## 2018-09-28 MED ORDER — CITALOPRAM HYDROBROMIDE 40 MG PO TABS: 40.0000 mg | ORAL_TABLET | Freq: Every day | ORAL | 1 refills | Status: AC

## 2018-09-28 MED ORDER — TIOTROPIUM BROMIDE MONOHYDRATE 18 MCG IN CAPS: 18.0000 ug | ORAL_CAPSULE | Freq: Every day | RESPIRATORY_TRACT | 11 refills | Status: DC

## 2018-09-28 MED ORDER — FLUTICASONE PROPIONATE 50 MCG/ACT NA SUSP: 1.0000 | Freq: Every day | NASAL | 6 refills | Status: DC | PRN

## 2018-09-28 NOTE — Progress Notes (Signed)
Subjective:    Patient ID: Isaac Harding, male    DOB: 1956-10-26, 62 y.o.   MRN: [id number]  HPI 62 year old male who presents in follow-up of COPD with chronic bronchitis.  Patient was last seen in April of this year by a volunteer pulmonologist, Dr. Delford Field.  Patient reports that he has not been taking his medications for his COPD or allergic rhinitis in a few months.  Patient was on [employer], Spiriva, loratadine and Flonase but states he is not taking these medications currently.  Patient states that he was also placed on Symbicort at one point and he feels that this medication did help improve his breathing.  Patient felt less short of breath while on this medication.  Patient states that he has been unable to stop smoking completely but is down to only 5 cigarettes/day whereas in April he was smoking 1 pack/day.  Patient states that he is under a lot of stress as his younger sister recently passed away from cancer.  Patient states that he acted as his younger sisters caregiver and would take her to all of her medical appointments.  Patient states that his sister passed away in hospice from stage IV lung and esophagus cancer.  Patient reports that he was followed at [facility] for anxiety and depression but had missed an appointment while caring for his sister and he states that they will not refill his medication, citalopram without a visit however his doctor at [facility] has not been in the office recently and therefore he is also been unable to schedule an appointment.  Patient would like to know if I would refill his citalopram while he attempts to reschedule with [facility].  Patient states that he had stopped this medication for a while but restarted citalopram use when his sister was getting sicker.  Patient feels that this medication does help with his anxiety.  Past Medical History:  Diagnosis Date  . COPD with chronic bronchitis (HCC)   . Depression    Family History  Problem Relation Age of  Onset  . Breast cancer Mother 54   Social History   Tobacco Use  . Smoking status: Current Every Day Smoker    Packs/day: 0.25    Types: Cigarettes  . Smokeless tobacco: Never Used  Substance Use Topics  . Alcohol use: No  . Drug use: Not on file   Allergies  Allergen Reactions  . Penicillins Itching      Review of Systems  Constitutional: Positive for fatigue. Negative for chills, diaphoresis and fever.  HENT: Positive for congestion, postnasal drip and rhinorrhea. Negative for sinus pressure, sinus pain, sneezing, sore throat and trouble swallowing.   Respiratory: Positive for cough and shortness of breath.   Cardiovascular: Negative for chest pain, palpitations and leg swelling.  Gastrointestinal: Negative for abdominal pain, constipation, diarrhea and nausea.  Genitourinary: Negative for dysuria and frequency.  Musculoskeletal: Negative for back pain and gait problem.  Neurological: Positive for headaches. Negative for dizziness.  Hematological: Negative for adenopathy. Does not bruise/bleed easily.  Psychiatric/Behavioral: Negative for self-injury and suicidal ideas. The patient is nervous/anxious.        Objective:   Physical Exam BP 109/74   Pulse 89   Temp 98 F (36.7 C) (Oral)   Ht 5\' 8"  (1.727 m)   SpO2 97%   BMI 25.03 kg/m  Vital signs and nurse's notes reviewed General- well-nourished, well-developed older male in no acute distress.  Patient looks slightly older than stated age. Skin-  patient with several raised, roughened papules on the face-forehead and cheek. ENT-TMs dull, nares with moderate edema of the nasal turbinates with edema/erythema and mild clear, mucoid nasal discharge, patient with posterior pharynx erythema Neck-supple, no lymphadenopathy, no thyromegaly, no carotid bruit Lungs- slightly hyperresonant breath sounds, no increased work of breathing Cardiovascular-regular rate and rhythm Abdomen-soft, nontender Back-no CVA  tenderness Extremities-no edema Psych- patient exhibits normal mood and judgment and gets slightly sad/tearful when talking about his sister's death       Assessment & Plan:  1. COPD with chronic bronchitis (HCC) Patient with COPD with chronic bronchitis.  Patient reports that he did feel that Symbicort helped in the past.  Patient is provided with new prescriptions for his Symbicort, albuterol, Spiriva and is encouraged to continue efforts at complete smoking cessation. - Comprehensive metabolic panel - albuterol (PROVENTIL HFA;VENTOLIN HFA) 108 (90 Base) MCG/ACT inhaler; Inhale 1-2 puffs into the lungs every 6 (six) hours as needed for wheezing or shortness of breath.  Dispense: 1 Inhaler; Refill: 6 - budesonide-formoterol (SYMBICORT) 80-4.5 MCG/ACT inhaler; Inhale 2 puffs into the lungs 2 (two) times daily.  Dispense: 1 Inhaler; Refill: 3 - tiotropium (SPIRIVA HANDIHALER) 18 MCG inhalation capsule; Place 1 capsule (18 mcg total) into inhaler and inhale daily.  Dispense: 30 capsule; Refill: 11  2. Allergic rhinitis, unspecified seasonality, unspecified trigger Patient provided with refills of Claritin and Flonase for treatment of allergic rhinitis - fluticasone (FLONASE) 50 MCG/ACT nasal spray; Place 1 spray into both nostrils daily as needed for allergies or rhinitis.  Dispense: 16 g; Refill: 6 - loratadine (CLARITIN) 10 MG tablet; Take 1 tablet (10 mg total) by mouth daily.  Dispense: 30 tablet; Refill: 6  3. Situational depression Patient with situational depression as well as history of anxiety and depression.  Patient is status post the death of his younger sister for whom patient was caregiver at the end of her life.  Patient is provided with refill of citalopram and patient is to continue efforts at schedule an appointment for follow-up at [facility] - Comprehensive metabolic panel - citalopram (CELEXA) 40 MG tablet; Take 1 tablet (40 mg total) by mouth daily.  Dispense: 30 tablet;  Refill: 1  4. Current every day smoker Patient is encouraged to continue efforts at smoking cessation.  Patient was prescribed Chantix at his last visit in April but patient states that he did not tolerate this medication.  Patient is also aware that he can use over-the-counter nicotine lozenges, chew sugar-free gum and use other methods to help with smoking cessation  5. Encounter for long-term (current) use of medications Patient will have CMP in follow-up of his long-term use of medications.  Patient has been back on his Celexa for over a month but is being restarted today on his prior medications for COPD and allergic rhinitis - Comprehensive metabolic panel  6. Thrombocytopenia (HCC) On review of patient's chart, patient has a history of thrombocytopenia.  Patient will have CBC done in follow-up as well as CMP to look for renal issues or liver enzyme abnormalities which may be contributing to his low platelet count - Comprehensive metabolic panel - CBC with Differential  7. Skin lesions, generalized Patient has some skin lesions on his face that are suspicious for skin cancer.  Patient does report long history of sun exposure in childhood and most of early adulthood.  Patient agrees to referral to dermatology for further evaluation and treatment but initially did not seem to think that he required further evaluation -  Ambulatory referral to Dermatology  *Patient was offered but declined influenza immunization while in the office  An After Visit Summary was printed and given to the patient.  Return in about 6 months (around 03/29/2019) for 1-2 months with Dr. Delford Field; 6 months with PCP and prn.

## 2018-09-29 LAB — CBC WITH DIFFERENTIAL/PLATELET
Basophils Absolute: 0.1 x10E3/uL (ref 0.0–0.2)
Basos: 1 %
EOS (ABSOLUTE): 0 x10E3/uL (ref 0.0–0.4)
Eos: 1 %
Hematocrit: 49.6 % (ref 37.5–51.0)
Hemoglobin: 17.1 g/dL (ref 13.0–17.7)
Immature Grans (Abs): 0 x10E3/uL (ref 0.0–0.1)
Immature Granulocytes: 0 %
Lymphocytes Absolute: 2.2 x10E3/uL (ref 0.7–3.1)
Lymphs: 25 %
MCH: 31.5 pg (ref 26.6–33.0)
MCHC: 34.5 g/dL (ref 31.5–35.7)
MCV: 92 fL (ref 79–97)
Monocytes Absolute: 0.7 x10E3/uL (ref 0.1–0.9)
Monocytes: 8 %
Neutrophils Absolute: 5.7 x10E3/uL (ref 1.4–7.0)
Neutrophils: 65 %
Platelets: 224 x10E3/uL (ref 150–450)
RBC: 5.42 x10E6/uL (ref 4.14–5.80)
RDW: 12.7 % (ref 12.3–15.4)
WBC: 8.7 x10E3/uL (ref 3.4–10.8)

## 2018-09-29 LAB — COMPREHENSIVE METABOLIC PANEL WITH GFR
ALT: 11 IU/L (ref 0–44)
AST: 17 IU/L (ref 0–40)
Albumin/Globulin Ratio: 2.2 (ref 1.2–2.2)
Albumin: 5 g/dL — ABNORMAL HIGH (ref 3.6–4.8)
Alkaline Phosphatase: 58 IU/L (ref 39–117)
BUN/Creatinine Ratio: 7 — ABNORMAL LOW (ref 10–24)
BUN: 9 mg/dL (ref 8–27)
Bilirubin Total: 0.6 mg/dL (ref 0.0–1.2)
CO2: 23 mmol/L (ref 20–29)
Calcium: 9.6 mg/dL (ref 8.6–10.2)
Chloride: 103 mmol/L (ref 96–106)
Creatinine, Ser: 1.32 mg/dL — ABNORMAL HIGH (ref 0.76–1.27)
GFR calc Af Amer: 67 mL/min/1.73
GFR calc non Af Amer: 58 mL/min/1.73 — ABNORMAL LOW
Globulin, Total: 2.3 g/dL (ref 1.5–4.5)
Glucose: 87 mg/dL (ref 65–99)
Potassium: 4 mmol/L (ref 3.5–5.2)
Sodium: 142 mmol/L (ref 134–144)
Total Protein: 7.3 g/dL (ref 6.0–8.5)

## 2018-10-23 DIAGNOSIS — F329 Major depressive disorder, single episode, unspecified: Secondary | ICD-10-CM | POA: Diagnosis not present

## 2019-01-08 ENCOUNTER — Ambulatory Visit: Payer: Medicaid Other | Admitting: Family Medicine

## 2019-02-01 DIAGNOSIS — F329 Major depressive disorder, single episode, unspecified: Secondary | ICD-10-CM | POA: Diagnosis not present

## 2019-02-01 DIAGNOSIS — F419 Anxiety disorder, unspecified: Secondary | ICD-10-CM | POA: Diagnosis not present

## 2019-02-08 ENCOUNTER — Ambulatory Visit: Payer: Medicaid Other | Admitting: Family Medicine

## 2019-03-16 ENCOUNTER — Other Ambulatory Visit: Payer: Self-pay | Admitting: Family Medicine

## 2019-03-16 DIAGNOSIS — J309 Allergic rhinitis, unspecified: Secondary | ICD-10-CM

## 2019-03-19 NOTE — Telephone Encounter (Signed)
Pt reports loratadine is no longer effective, pharmacy is requesting RX for cetirizine. Please authorize if appropriate.

## 2019-04-18 ENCOUNTER — Other Ambulatory Visit: Payer: Self-pay | Admitting: Family Medicine

## 2019-04-18 ENCOUNTER — Ambulatory Visit: Payer: Medicaid Other | Admitting: Family Medicine

## 2019-04-18 DIAGNOSIS — J309 Allergic rhinitis, unspecified: Secondary | ICD-10-CM

## 2019-04-24 ENCOUNTER — Ambulatory Visit: Payer: Medicaid Other | Attending: Primary Care | Admitting: Primary Care

## 2019-04-24 ENCOUNTER — Other Ambulatory Visit: Payer: Self-pay

## 2019-04-26 DIAGNOSIS — F419 Anxiety disorder, unspecified: Secondary | ICD-10-CM | POA: Diagnosis not present

## 2019-04-26 DIAGNOSIS — F329 Major depressive disorder, single episode, unspecified: Secondary | ICD-10-CM | POA: Diagnosis not present

## 2019-04-28 ENCOUNTER — Other Ambulatory Visit: Payer: Self-pay | Admitting: Family Medicine

## 2019-04-28 DIAGNOSIS — J309 Allergic rhinitis, unspecified: Secondary | ICD-10-CM

## 2019-08-05 DIAGNOSIS — Z23 Encounter for immunization: Secondary | ICD-10-CM | POA: Diagnosis not present

## 2019-08-21 ENCOUNTER — Other Ambulatory Visit: Payer: Self-pay | Admitting: Family Medicine

## 2019-08-21 DIAGNOSIS — J309 Allergic rhinitis, unspecified: Secondary | ICD-10-CM

## 2019-08-27 ENCOUNTER — Telehealth: Payer: Self-pay | Admitting: Family Medicine

## 2019-08-27 DIAGNOSIS — J309 Allergic rhinitis, unspecified: Secondary | ICD-10-CM

## 2019-08-27 MED ORDER — FLUTICASONE PROPIONATE 50 MCG/ACT NA SUSP: 1.0000 | Freq: Every day | NASAL | 0 refills | Status: DC | PRN

## 2019-08-27 NOTE — Telephone Encounter (Signed)
1) Medication(s) Requested (by name): fluticasone (FLONASE) 50 MCG/ACT nasal spray   2) [facility] [place]  3) Special Requests:   Approved medications will be sent to the pharmacy, we will reach out if there is an issue.  Requests made after 3pm may not be addressed until the following business day!  If a patient is unsure of the name of the medication(s) please note and ask patient to call back when they are able to provide all info, do not send to responsible party until all information is available!

## 2019-08-27 NOTE — Telephone Encounter (Signed)
Spoke with pt and he will come for his appt 09-14-19

## 2019-08-27 NOTE — Telephone Encounter (Signed)
Pt needs appt. Refill sent for 1 month supply.

## 2019-09-14 ENCOUNTER — Encounter: Payer: Self-pay | Admitting: Family Medicine

## 2019-09-14 ENCOUNTER — Other Ambulatory Visit: Payer: Self-pay

## 2019-09-14 ENCOUNTER — Ambulatory Visit: Payer: Medicaid Other | Attending: Family Medicine | Admitting: Family Medicine

## 2019-09-14 DIAGNOSIS — J309 Allergic rhinitis, unspecified: Secondary | ICD-10-CM

## 2019-09-14 DIAGNOSIS — F17200 Nicotine dependence, unspecified, uncomplicated: Secondary | ICD-10-CM

## 2019-09-14 DIAGNOSIS — J449 Chronic obstructive pulmonary disease, unspecified: Secondary | ICD-10-CM | POA: Diagnosis not present

## 2019-09-14 DIAGNOSIS — J4489 Other specified chronic obstructive pulmonary disease: Secondary | ICD-10-CM

## 2019-09-14 MED ORDER — ALBUTEROL SULFATE HFA 108 (90 BASE) MCG/ACT IN AERS: 1.0000 | INHALATION_SPRAY | Freq: Four times a day (QID) | RESPIRATORY_TRACT | 99 refills | Status: DC | PRN

## 2019-09-14 MED ORDER — CETIRIZINE HCL 10 MG PO TABS: 10.0000 mg | ORAL_TABLET | Freq: Every day | ORAL | 99 refills | Status: DC

## 2019-09-14 MED ORDER — BUDESONIDE-FORMOTEROL FUMARATE 80-4.5 MCG/ACT IN AERO: 2.0000 | INHALATION_SPRAY | Freq: Two times a day (BID) | RESPIRATORY_TRACT | 5 refills | Status: DC

## 2019-09-14 MED ORDER — SPIRIVA HANDIHALER 18 MCG IN CAPS: 18.0000 ug | ORAL_CAPSULE | Freq: Every day | RESPIRATORY_TRACT | 5 refills | Status: DC

## 2019-09-14 MED ORDER — FLUTICASONE PROPIONATE 50 MCG/ACT NA SUSP: 1.0000 | Freq: Every day | NASAL | 99 refills | Status: DC | PRN

## 2019-09-14 NOTE — Progress Notes (Signed)
Virtual Visit via Telephone Note  I connected with Isaac Harding on 09/14/19 at  1:30 PM EDT by telephone and verified that I am speaking with the correct person using two identifiers.   I discussed the limitations, risks, security and privacy concerns of performing an evaluation and management service by telephone and the availability of in person appointments. I also discussed with the patient that there may be a patient responsible charge related to this service. The patient expressed understanding and agreed to proceed.  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  He reports that his breathing is fairly stable, he has some good days and bad days.  He did have some recent increase in shortness of breath/chest congestion when the weather was colder.  He does have occasional cough with production of sputum but sputum has currently been clear.  He denies any fever or chills.  He has had no recent wheezing.  He does need refills of all of his medications.  He is smoking about half a pack per day of cigarettes.  He reports that this is less than he has smoked in the past.  He feels that smoking helps with his anxiety and depression and he does not feel that he is ready to further decrease his tobacco intake or stop smoking.  He has had influenza immunization at his local pharmacy.         Patient additionally has a history of depression for which he is being followed at [facility] mental health and he states that he feels the depression is stable.  He is on medications.  He is seen the same mental health provider for about 20 years.  He denies any suicidal thoughts or ideations          He also requires medications for refill of allergic rhinitis.  He does have occasional nasal  congestion and takes Zyrtec and uses Flonase as needed.  He denies any current issues with facial pressure, no nasal discharge or postnasal drainage, no sore throat.  On review of systems he has had no chest pain or palpitations, no difficulty swallowing.  No abdominal pain-no nausea or vomiting.  No urinary frequency or dysuria.  No peripheral edema or increased muscle or joint pain.   Past Medical History:  Diagnosis Date  . COPD with chronic bronchitis ([place])   . Depression     Past Surgical History:  Procedure Laterality Date  . NO PAST SURGERIES      Family History  Problem Relation Age of Onset  . Breast cancer Mother 37    Social History   Tobacco Use  . Smoking status: Current Every Day Smoker    Packs/day: 0.25    Types: Cigarettes  . Smokeless tobacco: Never Used  Substance Use Topics  . Alcohol use: No  . Drug use: Never     Allergies  Allergen Reactions  . Penicillins Itching       Observations/Objective: No vital signs or physical exam conducted as visit was done via telephone  Assessment and Plan: 1. COPD with chronic bronchitis ([place]); 3.  Tobacco use disorder He reports that he received his influenza immunization at [facility] in [place].  He does continue to smoke but states he is down to  about half pack per day and does not believe that he can quit at this time as he feels that smoking also helps to calm his nerves.  He is provided with refills of his albuterol, Symbicort and Spiriva.  He is encouraged to contact the office if he has any worsening of his breathing, productive cough, chest congestion or any concerns.  Patient otherwise will follow-up in 6 months.  He is to call 911 if he has any acute difficulty with breathing/shortness of breath or any other concerns. - albuterol (VENTOLIN HFA) 108 (90 Base) MCG/ACT inhaler; Inhale 1-2 puffs into the lungs every 6 (six) hours as needed for wheezing or shortness of breath.  Dispense: 18 g; Refill: prn -  budesonide-formoterol (SYMBICORT) 80-4.5 MCG/ACT inhaler; Inhale 2 puffs into the lungs 2 (two) times daily.  Dispense: 1 Inhaler; Refill: 5 - tiotropium (SPIRIVA HANDIHALER) 18 MCG inhalation capsule; Place 1 capsule (18 mcg total) into inhaler and inhale daily.  Dispense: 30 capsule; Refill: 5  2. Allergic rhinitis, unspecified seasonality, unspecified trigger Refills provided of generic Zyrtec and Flonase to use as needed for treatment of allergic rhinitis. - cetirizine (ZYRTEC) 10 MG tablet; Take 1 tablet (10 mg total) by mouth daily. MUST MAKE APPT FOR FURTHER REFILLS  Dispense: 30 tablet; Refill: prn - fluticasone (FLONASE) 50 MCG/ACT nasal spray; Place 1 spray into both nostrils daily as needed for allergies or rhinitis.  Dispense: 16 g; Refill: prn   Follow Up Instructions:Return in about 6 months (around 03/14/2020) for COPD-and as needed.    I discussed the assessment and treatment plan with the patient. The patient was provided an opportunity to ask questions and all were answered. The patient agreed with the plan and demonstrated an understanding of the instructions.   The patient was advised to call back or seek an in-person evaluation if the symptoms worsen or if the condition fails to improve as anticipated.  I provided 12 minutes of non-face-to-face time during this encounter.   Cain Saupe, MD

## 2019-09-14 NOTE — Progress Notes (Signed)
Med refills

## 2019-11-19 ENCOUNTER — Other Ambulatory Visit: Payer: Self-pay | Admitting: Family Medicine

## 2019-11-19 DIAGNOSIS — J309 Allergic rhinitis, unspecified: Secondary | ICD-10-CM

## 2020-02-13 DIAGNOSIS — F419 Anxiety disorder, unspecified: Secondary | ICD-10-CM | POA: Diagnosis not present

## 2020-02-13 DIAGNOSIS — F329 Major depressive disorder, single episode, unspecified: Secondary | ICD-10-CM | POA: Diagnosis not present

## 2020-03-07 ENCOUNTER — Ambulatory Visit: Payer: Medicaid Other | Admitting: Family Medicine

## 2020-03-19 ENCOUNTER — Encounter: Payer: Self-pay | Admitting: Family Medicine

## 2020-03-19 ENCOUNTER — Ambulatory Visit: Payer: Medicaid Other | Attending: Family Medicine | Admitting: Family Medicine

## 2020-03-19 ENCOUNTER — Other Ambulatory Visit: Payer: Self-pay

## 2020-03-19 DIAGNOSIS — Z720 Tobacco use: Secondary | ICD-10-CM | POA: Diagnosis not present

## 2020-03-19 DIAGNOSIS — J449 Chronic obstructive pulmonary disease, unspecified: Secondary | ICD-10-CM | POA: Diagnosis not present

## 2020-03-19 DIAGNOSIS — F4321 Adjustment disorder with depressed mood: Secondary | ICD-10-CM

## 2020-03-19 MED ORDER — BUDESONIDE-FORMOTEROL FUMARATE 80-4.5 MCG/ACT IN AERO: 2.0000 | INHALATION_SPRAY | Freq: Two times a day (BID) | RESPIRATORY_TRACT | 5 refills | Status: DC

## 2020-03-19 MED ORDER — ALBUTEROL SULFATE HFA 108 (90 BASE) MCG/ACT IN AERS: 1.0000 | INHALATION_SPRAY | Freq: Four times a day (QID) | RESPIRATORY_TRACT | 99 refills | Status: DC | PRN

## 2020-03-19 MED ORDER — NICOTINE 7 MG/24HR TD PT24: 7.0000 mg | MEDICATED_PATCH | Freq: Every day | TRANSDERMAL | 3 refills | Status: DC

## 2020-03-19 MED ORDER — SPIRIVA HANDIHALER 18 MCG IN CAPS: 18.0000 ug | ORAL_CAPSULE | Freq: Every day | RESPIRATORY_TRACT | 5 refills | Status: DC

## 2020-03-19 NOTE — Progress Notes (Signed)
States that he has been sleepy since her got his 2nd shot.

## 2020-03-19 NOTE — Progress Notes (Signed)
Virtual Visit via Telephone Note  I connected with Isaac Harding, on 03/19/2020 at 8:37 AM by telephone due to the COVID-19 pandemic and verified that I am speaking with the correct person using two identifiers.   Consent: I discussed the limitations, risks, security and privacy concerns of performing an evaluation and management service by telephone and the availability of in person appointments. I also discussed with the patient that there may be a patient responsible charge related to this service. The patient expressed understanding and agreed to proceed.   Location of Patient: Home  Location of Provider: Clinic   Persons participating in Telemedicine visit: Zyeir Dymek Farrington-CMA Dr. Margarita Rana     History of Present Illness: Isaac Harding is a 64 year old male patient of Dr Chapman Fitch with a history of depression, COPD, tobacco abuse who presents today for chronic disease management.  He saw his Psychiatrist at mental health recently and is compliant with Citalopram for his anxiety and this is stable. His COPD has not flared up and he is compliant with Symbicort, Spiriva and Albuterol. Smokes 5 cig/day. He received his second dose of Moderna vaccine last week and has been sleepy.  Past Medical History:  Diagnosis Date  . COPD with chronic bronchitis ([place])   . Depression    Allergies  Allergen Reactions  . Penicillins Itching    Current Outpatient Medications on File Prior to Visit  Medication Sig Dispense Refill  . acetaminophen (TYLENOL) 500 MG tablet Take 500 mg by mouth every 6 (six) hours as needed for headache.    . albuterol (VENTOLIN HFA) 108 (90 Base) MCG/ACT inhaler Inhale 1-2 puffs into the lungs every 6 (six) hours as needed for wheezing or shortness of breath. 18 g prn  . budesonide-formoterol (SYMBICORT) 80-4.5 MCG/ACT inhaler Inhale 2 puffs into the lungs 2 (two) times daily. 1 Inhaler 5  . cetirizine (ZYRTEC) 10 MG tablet TAKE 1 TABLET (10 MG  TOTAL) BY MOUTH DAILY. MUST MAKE APPT FOR FURTHER REFILLS 30 tablet 2  . citalopram (CELEXA) 40 MG tablet Take 1 tablet (40 mg total) by mouth daily. 30 tablet 1  . fluticasone (FLONASE) 50 MCG/ACT nasal spray Place 1 spray into both nostrils daily as needed for allergies or rhinitis. 16 g prn  . tiotropium (SPIRIVA HANDIHALER) 18 MCG inhalation capsule Place 1 capsule (18 mcg total) into inhaler and inhale daily. 30 capsule 5   No current facility-administered medications on file prior to visit.    Observations/Objective: Awake, alert, oriented x3 Not in acute distress  Assessment and Plan: 1. COPD with chronic bronchitis (HCC) Stable Advised that smoking cessation will retard progression of condition - tiotropium (SPIRIVA HANDIHALER) 18 MCG inhalation capsule; Place 1 capsule (18 mcg total) into inhaler and inhale daily.  Dispense: 30 capsule; Refill: 5 - budesonide-formoterol (SYMBICORT) 80-4.5 MCG/ACT inhaler; Inhale 2 puffs into the lungs 2 (two) times daily.  Dispense: 1 Inhaler; Refill: 5 - albuterol (VENTOLIN HFA) 108 (90 Base) MCG/ACT inhaler; Inhale 1-2 puffs into the lungs every 6 (six) hours as needed for wheezing or shortness of breath.  Dispense: 18 g; Refill: prn - CMP14+EGFR; Future  2. Tobacco abuse Spent 3 minutes counseling on smoking cessation and he is ready to quit - nicotine (NICODERM CQ) 7 mg/24hr patch; Place 1 patch (7 mg total) onto the skin daily.  Dispense: 28 patch; Refill: 3  3. Situational depression Doing well on citalopram Follow-up with psychiatry.   Follow Up Instructions: 6 months with PCP for chronic  disease management   I discussed the assessment and treatment plan with the patient. The patient was provided an opportunity to ask questions and all were answered. The patient agreed with the plan and demonstrated an understanding of the instructions.   The patient was advised to call back or seek an in-person evaluation if the symptoms worsen or  if the condition fails to improve as anticipated.     I provided 12 minutes total of non-face-to-face time during this encounter including median intraservice time, reviewing previous notes, investigations, ordering medications, medical decision making, coordinating care and patient verbalized understanding at the end of the visit.     Charlott Rakes, MD, FAAFP. [facility] [place], [place]   03/19/2020, 8:37 AM

## 2020-03-28 ENCOUNTER — Other Ambulatory Visit: Payer: Self-pay | Admitting: Family Medicine

## 2020-03-28 ENCOUNTER — Other Ambulatory Visit: Payer: Medicaid Other

## 2020-03-28 DIAGNOSIS — J309 Allergic rhinitis, unspecified: Secondary | ICD-10-CM

## 2020-04-14 ENCOUNTER — Ambulatory Visit: Payer: Medicaid Other | Attending: Family Medicine

## 2020-04-14 ENCOUNTER — Other Ambulatory Visit: Payer: Self-pay

## 2020-04-14 DIAGNOSIS — J449 Chronic obstructive pulmonary disease, unspecified: Secondary | ICD-10-CM | POA: Diagnosis not present

## 2020-04-15 LAB — CMP14+EGFR
ALT: 19 IU/L (ref 0–44)
AST: 25 IU/L (ref 0–40)
Albumin/Globulin Ratio: 1.9 (ref 1.2–2.2)
Albumin: 4.7 g/dL (ref 3.8–4.8)
Alkaline Phosphatase: 66 IU/L (ref 48–121)
BUN/Creatinine Ratio: 8 — ABNORMAL LOW (ref 10–24)
BUN: 11 mg/dL (ref 8–27)
Bilirubin Total: 0.4 mg/dL (ref 0.0–1.2)
CO2: 22 mmol/L (ref 20–29)
Calcium: 9.7 mg/dL (ref 8.6–10.2)
Chloride: 101 mmol/L (ref 96–106)
Creatinine, Ser: 1.43 mg/dL — ABNORMAL HIGH (ref 0.76–1.27)
GFR calc Af Amer: 60 mL/min/{1.73_m2} (ref >59)
GFR calc non Af Amer: 52 mL/min/{1.73_m2} — ABNORMAL LOW (ref >59)
Globulin, Total: 2.5 g/dL (ref 1.5–4.5)
Glucose: 101 mg/dL — ABNORMAL HIGH (ref 65–99)
Potassium: 4.1 mmol/L (ref 3.5–5.2)
Sodium: 139 mmol/L (ref 134–144)
Total Protein: 7.2 g/dL (ref 6.0–8.5)

## 2020-04-16 ENCOUNTER — Telehealth: Payer: Self-pay

## 2020-04-16 NOTE — Telephone Encounter (Signed)
Patient name and DOB has been verified Patient was informed of lab results. Patient had no questions.  

## 2020-04-16 NOTE — Telephone Encounter (Signed)
-----   Message from Marcine Matar, MD sent at 04/15/2020  6:29 PM EDT ----- Let patient know that his kidney function is not 100% but stable compared to an year ago.  He should avoid over-the-counter pain medications like ibuprofen, Aleve, Advil, Naprosyn and Motrin as these can make kidney function worse.

## 2020-06-02 ENCOUNTER — Other Ambulatory Visit: Payer: Self-pay | Admitting: Family Medicine

## 2020-06-02 DIAGNOSIS — J309 Allergic rhinitis, unspecified: Secondary | ICD-10-CM

## 2020-06-02 NOTE — Telephone Encounter (Signed)
Requested Prescriptions  Pending Prescriptions Disp Refills   cetirizine (ZYRTEC) 10 MG tablet [Pharmacy Med Name: CETIRIZINE HCL 10 MG TABLET] 90 tablet 3    Sig: TAKE 1 TABLET BY MOUTH EVERY DAY     Ear, Nose, and Throat:  Antihistamines Passed - 06/02/2020  4:37 PM      Passed - Valid encounter within last 12 months    Recent Outpatient Visits          2 months ago Tobacco abuse   [facility] [place], Odette Horns, MD   8 months ago COPD with chronic bronchitis (HCC)   [facility] [place], [place], MD   1 year ago COPD with chronic bronchitis (HCC)   [facility] [place], [place], MD   2 years ago COPD with chronic bronchitis (HCC)   [facility] Storm Frisk, MD   2 years ago COPD with chronic bronchitis [facility])   [place] [facility] [facility] [place], Oren Beckmann, FNP      Future Appointments            In 3 months Cain Saupe, MD [facility]

## 2020-06-05 DIAGNOSIS — W57XXXA Bitten or stung by nonvenomous insect and other nonvenomous arthropods, initial encounter: Secondary | ICD-10-CM | POA: Diagnosis not present

## 2020-06-05 DIAGNOSIS — S40261A Insect bite (nonvenomous) of right shoulder, initial encounter: Secondary | ICD-10-CM | POA: Diagnosis not present

## 2020-06-20 DIAGNOSIS — F419 Anxiety disorder, unspecified: Secondary | ICD-10-CM | POA: Diagnosis not present

## 2020-06-20 DIAGNOSIS — F329 Major depressive disorder, single episode, unspecified: Secondary | ICD-10-CM | POA: Diagnosis not present

## 2020-09-03 ENCOUNTER — Ambulatory Visit: Payer: Medicaid Other | Admitting: Family Medicine

## 2020-09-25 ENCOUNTER — Ambulatory Visit: Payer: Medicaid Other | Admitting: Family Medicine

## 2020-10-23 ENCOUNTER — Ambulatory Visit: Payer: Medicaid Other | Admitting: Family Medicine

## 2020-12-09 ENCOUNTER — Ambulatory Visit: Payer: Medicaid Other | Attending: Internal Medicine | Admitting: Internal Medicine

## 2020-12-09 ENCOUNTER — Ambulatory Visit: Payer: Medicaid Other | Admitting: Internal Medicine

## 2020-12-09 ENCOUNTER — Other Ambulatory Visit: Payer: Self-pay

## 2020-12-25 DIAGNOSIS — F329 Major depressive disorder, single episode, unspecified: Secondary | ICD-10-CM | POA: Diagnosis not present

## 2020-12-25 DIAGNOSIS — F419 Anxiety disorder, unspecified: Secondary | ICD-10-CM | POA: Diagnosis not present

## 2021-01-20 ENCOUNTER — Ambulatory Visit: Payer: Medicaid Other | Admitting: Internal Medicine

## 2021-01-22 ENCOUNTER — Ambulatory Visit: Payer: Medicaid Other | Attending: Internal Medicine | Admitting: Internal Medicine

## 2021-01-22 ENCOUNTER — Encounter: Payer: Self-pay | Admitting: Internal Medicine

## 2021-01-22 ENCOUNTER — Other Ambulatory Visit: Payer: Self-pay

## 2021-01-22 VITALS — BP 125/83 | HR 87 | Resp 16 | Wt 179.2 lb

## 2021-01-22 DIAGNOSIS — Z720 Tobacco use: Secondary | ICD-10-CM

## 2021-01-22 DIAGNOSIS — Z1211 Encounter for screening for malignant neoplasm of colon: Secondary | ICD-10-CM | POA: Diagnosis not present

## 2021-01-22 DIAGNOSIS — J449 Chronic obstructive pulmonary disease, unspecified: Secondary | ICD-10-CM | POA: Diagnosis not present

## 2021-01-22 MED ORDER — ALBUTEROL SULFATE HFA 108 (90 BASE) MCG/ACT IN AERS: 1.0000 | INHALATION_SPRAY | Freq: Four times a day (QID) | RESPIRATORY_TRACT | 11 refills | Status: AC | PRN

## 2021-01-22 MED ORDER — SPIRIVA HANDIHALER 18 MCG IN CAPS: 18.0000 ug | ORAL_CAPSULE | Freq: Every day | RESPIRATORY_TRACT | 6 refills | Status: DC

## 2021-01-22 MED ORDER — BUDESONIDE-FORMOTEROL FUMARATE 80-4.5 MCG/ACT IN AERO: 2.0000 | INHALATION_SPRAY | Freq: Two times a day (BID) | RESPIRATORY_TRACT | 11 refills | Status: DC

## 2021-01-22 NOTE — Progress Notes (Signed)
Patient ID: Isaac Harding, male    DOB: [birth year]  MRN: [id number]  CC: COPD   Subjective: Isaac Harding is a 65 y.o. male who presents for chronic disease management.Marland Kitchen  His PCP was Dr. Jillyn Harding who is no longer with the practice. concerns today include:  Patient with history of COPD, tobacco dependence, depression  COPD: reports using Symbicort and Spiriva one puff a day.  Using Albuterol upper to 3 times a day.    Last rxn on Spiriva was sent 02/2020 by Dr. Alvis Harding with 5 RF.  Not using Zyrtec. Still smoking.  Down to 2-3 cigarettes a day.  Previous was 1/2 pk a day.  Smoked since age 22.  Has patches at home but not using them consistently.  Wants to quit. Thinks he will need to stop cold [place]. Quit for 2 wks one time.  Depression/Anxiety:  Sees Isaac Harding at [facility].  On BuSpar and Celexa  HM: Reports [facility] mths ago.  Due for colon ca screen Patient Active Problem List   Diagnosis Date Noted  . History of depression 11/17/2017  . Tobacco use disorder 07/21/2016  . COPD with chronic bronchitis (HCC) 04/14/2016     Current Outpatient Medications on File Prior to Visit  Medication Sig Dispense Refill  . acetaminophen (TYLENOL) 500 MG tablet Take 500 mg by mouth every 6 (six) hours as needed for headache.    . albuterol (VENTOLIN HFA) 108 (90 Base) MCG/ACT inhaler Inhale 1-2 puffs into the lungs every 6 (six) hours as needed for wheezing or shortness of breath. 18 g prn  . budesonide-formoterol (SYMBICORT) 80-4.5 MCG/ACT inhaler Inhale 2 puffs into the lungs 2 (two) times daily. 1 Inhaler 5  . cetirizine (ZYRTEC) 10 MG tablet TAKE 1 TABLET BY MOUTH EVERY DAY 90 tablet 3  . citalopram (CELEXA) 40 MG tablet Take 1 tablet (40 mg total) by mouth daily. 30 tablet 1  . fluticasone (FLONASE) 50 MCG/ACT nasal spray Place 1 spray into both nostrils daily as needed for allergies or rhinitis. 16 g prn  . nicotine (NICODERM CQ) 7 mg/24hr patch Place 1 patch (7 mg total) onto the  skin daily. 28 patch 3  . tiotropium (SPIRIVA HANDIHALER) 18 MCG inhalation capsule Place 1 capsule (18 mcg total) into inhaler and inhale daily. 30 capsule 5   No current facility-administered medications on file prior to visit.    Allergies  Allergen Reactions  . Penicillins Itching    Social History   Socioeconomic History  . Marital status: Divorced    Spouse name: Not on file  . Number of children: Not on file  . Years of education: Not on file  . Highest education level: Not on file  Occupational History  . Not on file  Tobacco Use  . Smoking status: Current Every Day Smoker    Packs/day: 0.25    Types: Cigarettes  . Smokeless tobacco: Never Used  Vaping Use  . Vaping Use: Never used  Substance and Sexual Activity  . Alcohol use: No  . Drug use: Never  . Sexual activity: Not on file  Other Topics Concern  . Not on file  Social History Narrative  . Not on file   Social Determinants of Health   Financial Resource Strain: Not on file  Food Insecurity: Not on file  Transportation Needs: Not on file  Physical Activity: Not on file  Stress: Not on file  Social Connections: Not on file  Intimate Partner Violence: Not on file    Family History  Problem Relation Age of Onset  . Breast cancer Mother 70    Past Surgical History:  Procedure Laterality Date  . NO PAST SURGERIES      ROS: Review of Systems Negative except as stated above  PHYSICAL EXAM: BP 125/83   Pulse 87   Resp 16   Wt 179 lb 3.2 oz (81.3 kg)   SpO2 96%   BMI 27.25 kg/m   Wt Readings from Last 3 Encounters:  01/22/21 179 lb 3.2 oz (81.3 kg)  03/15/18 164 lb 9.6 oz (74.7 kg)  11/17/17 166 lb 12.8 oz (75.7 kg)    Physical Exam  General appearance - alert, well appearing, elderly Caucasian male and in no distress Mental status - normal mood, behavior, speech, dress, motor activity, and thought processes Mouth - mucous membranes moist, pharynx normal without lesions Neck -  supple, no significant adenopathy Chest -mildly decreased bilaterally without wheezes or crackles. Heart - normal rate, regular rhythm, normal S1, S2, no murmurs, rubs, clicks or gallops Extremities -no lower extremity edema.  No cyanosis of the lips of the fingers.  CMP Latest Ref Rng & Units 04/14/2020 09/28/2018 12/18/2015  Glucose 65 - 99 mg/dL 578(I) 87 696(E)  BUN 8 - 27 mg/dL 11 9 8   Creatinine 0.76 - 1.27 mg/dL ) 9.52(W) 4.13(K  Sodium 134 - 144 mmol/L 139 142 140  Potassium 3.5 - 5.2 mmol/L 4.1 4.0 3.4(L)  Chloride 96 - 106 mmol/L 101 103 100(L)  CO2 20 - 29 mmol/L 22 23 28   Calcium 8.6 - 10.2 mg/dL 9.7 9.6 9.2  Total Protein 6.0 - 8.5 g/dL 7.2 7.3 -  Total Bilirubin 0.0 - 1.2 mg/dL 0.4 0.6 -  Alkaline Phos 48 - 121 IU/L 66 58 -  AST 0 - 40 IU/L 25 17 -  ALT 0 - 44 IU/L 19 11 -   Lipid Panel  No results found for: CHOL, TRIG, HDL, CHOLHDL, VLDL, LDLCALC, LDLDIRECT  CBC    Component Value Date/Time   WBC 8.7 09/28/2018 1622   WBC 7.8 12/18/2015 1820   RBC 5.42 09/28/2018 1622   RBC 5.06 12/18/2015 1820   HGB 17.1 09/28/2018 1622   HCT 49.6 09/28/2018 1622   PLT 224 09/28/2018 1622   MCV 92 09/28/2018 1622   MCH 31.5 09/28/2018 1622   MCH 31.2 12/18/2015 1820   MCHC 34.5 09/28/2018 1622   MCHC 34.3 12/18/2015 1820   RDW 12.7 09/28/2018 1622   LYMPHSABS 2.2 09/28/2018 1622   MONOABS 0.4 12/18/2015 1820   EOSABS 0.0 09/28/2018 1622   BASOSABS 0.1 09/28/2018 1622    ASSESSMENT AND PLAN: 1. COPD with chronic bronchitis (HCC) Not as controlled as he could be.  Recommend that he use the Symbicort 2 puffs twice a day instead of just once a day.  Continue Spiriva.  Strongly advised to quit smoking. - tiotropium (SPIRIVA HANDIHALER) 18 MCG inhalation capsule; Place 1 capsule (18 mcg total) into inhaler and inhale daily.  Dispense: 30 capsule; Refill: 6 - albuterol (VENTOLIN HFA) 108 (90 Base) MCG/ACT inhaler; Inhale 1-2 puffs into the lungs every 6 (six) hours as  needed for wheezing or shortness of breath.  Dispense: 18 g; Refill: 11 - budesonide-formoterol (SYMBICORT) 80-4.5 MCG/ACT inhaler; Inhale 2 puffs into the lungs 2 (two) times daily.  Dispense: 1 each; Refill: 11  2. Tobacco abuse Advised to quit.  Health risks associated with smoking discussed.  He is  wanting to quit but feels he would have to do so cold [place].  I told him he can try cold [place] or try weaning himself off by 1 cigarette every week until he stops.  Encouraged him to set a quit date.  Less than 5 minutes spent on counseling. - CBC - Comprehensive metabolic panel - Lipid panel  3. Screening for colon cancer Discussed colon cancer screening methods.  Patient prefers to have the Cologuard test. - Cologuard    Patient was given the opportunity to ask questions.  Patient verbalized understanding of the plan and was able to repeat key elements of the plan.   No orders of the defined types were placed in this encounter.    Requested Prescriptions    No prescriptions requested or ordered in this encounter    No follow-ups on file.  Jonah Blue, MD, FACP

## 2021-01-22 NOTE — Patient Instructions (Signed)
Steps to Quit Smoking Smoking tobacco is the leading cause of preventable death. It can affect almost every organ in the body. Smoking puts you and people around you at risk for many serious, long-lasting (chronic) diseases. Quitting smoking can be hard, but it is one of the best things that you can do for your health. It is never too late to quit. How do I get ready to quit? When you decide to quit smoking, make a plan to help you succeed. Before you quit:  Pick a date to quit. Set a date within the next 2 weeks to give you time to prepare.  Write down the reasons why you are quitting. Keep this list in places where you will see it often.  Tell your family, friends, and co-workers that you are quitting. Their support is important.  Talk with your doctor about the choices that may help you quit.  Find out if your health insurance will pay for these treatments.  Know the people, places, things, and activities that make you want to smoke (triggers). Avoid them. What first steps can I take to quit smoking?  Throw away all cigarettes at home, at work, and in your car.  Throw away the things that you use when you smoke, such as ashtrays and lighters.  Clean your car. Make sure to empty the ashtray.  Clean your home, including curtains and carpets. What can I do to help me quit smoking? Talk with your doctor about taking medicines and seeing a counselor at the same time. You are more likely to succeed when you do both.  If you are pregnant or breastfeeding, talk with your doctor about counseling or other ways to quit smoking. Do not take medicine to help you quit smoking unless your doctor tells you to do so. To quit smoking: Quit right away  Quit smoking totally, instead of slowly cutting back on how much you smoke over a period of time.  Go to counseling. You are more likely to quit if you go to counseling sessions regularly. Take medicine You may take medicines to help you quit. Some  medicines need a prescription, and some you can buy over-the-counter. Some medicines may contain a drug called nicotine to replace the nicotine in cigarettes. Medicines may:  Help you to stop having the desire to smoke (cravings).  Help to stop the problems that come when you stop smoking (withdrawal symptoms). Your doctor may ask you to use:  Nicotine patches, gum, or lozenges.  Nicotine inhalers or sprays.  Non-nicotine medicine that is taken by mouth. Find resources Find resources and other ways to help you quit smoking and remain smoke-free after you quit. These resources are most helpful when you use them often. They include:  Online chats with a counselor.  Phone quitlines.  Printed self-help materials.  Support groups or group counseling.  Text messaging programs.  Mobile phone apps. Use apps on your mobile phone or tablet that can help you stick to your quit plan. There are many free apps for mobile phones and tablets as well as websites. Examples include Quit Guide from the CDC and smokefree.gov   What things can I do to make it easier to quit?  Talk to your family and friends. Ask them to support and encourage you.  Call a phone quitline (1-800-QUIT-NOW), reach out to support groups, or work with a counselor.  Ask people who smoke to not smoke around you.  Avoid places that make you want to smoke,   such as: ? Bars. ? Parties. ? Smoke-break areas at work.  Spend time with people who do not smoke.  Lower the stress in your life. Stress can make you want to smoke. Try these things to help your stress: ? Getting regular exercise. ? Doing deep-breathing exercises. ? Doing yoga. ? Meditating. ? Doing a body scan. To do this, close your eyes, focus on one area of your body at a time from head to toe. Notice which parts of your body are tense. Try to relax the muscles in those areas.   How will I feel when I quit smoking? Day 1 to 3 weeks Within the first 24 hours,  you may start to have some problems that come from quitting tobacco. These problems are very bad 2-3 days after you quit, but they do not often last for more than 2-3 weeks. You may get these symptoms:  Mood swings.  Feeling restless, nervous, angry, or annoyed.  Trouble concentrating.  Dizziness.  Strong desire for high-sugar foods and nicotine.  Weight gain.  Trouble pooping (constipation).  Feeling like you may vomit (nausea).  Coughing or a sore throat.  Changes in how the medicines that you take for other issues work in your body.  Depression.  Trouble sleeping (insomnia). Week 3 and afterward After the first 2-3 weeks of quitting, you may start to notice more positive results, such as:  Better sense of smell and taste.  Less coughing and sore throat.  Slower heart rate.  Lower blood pressure.  Clearer skin.  Better breathing.  Fewer sick days. Quitting smoking can be hard. Do not give up if you fail the first time. Some people need to try a few times before they succeed. Do your best to stick to your quit plan, and talk with your doctor if you have any questions or concerns. Summary  Smoking tobacco is the leading cause of preventable death. Quitting smoking can be hard, but it is one of the best things that you can do for your health.  When you decide to quit smoking, make a plan to help you succeed.  Quit smoking right away, not slowly over a period of time.  When you start quitting, seek help from your doctor, family, or friends. This information is not intended to replace advice given to you by your health care Isaac Harding. Make sure you discuss any questions you have with your health care Isaac Harding. Document Revised: 08/03/2019 Document Reviewed: 01/27/2019 Elsevier Patient Education  2021 [employer].  

## 2021-01-23 ENCOUNTER — Other Ambulatory Visit: Payer: Self-pay | Admitting: Internal Medicine

## 2021-01-23 DIAGNOSIS — E782 Mixed hyperlipidemia: Secondary | ICD-10-CM

## 2021-01-23 LAB — LIPID PANEL
Chol/HDL Ratio: 4.4 ratio (ref 0.0–5.0)
Cholesterol, Total: 232 mg/dL — ABNORMAL HIGH (ref 100–199)
HDL: 53 mg/dL (ref >39)
LDL Chol Calc (NIH): 137 mg/dL — ABNORMAL HIGH (ref 0–99)
Triglycerides: 233 mg/dL — ABNORMAL HIGH (ref 0–149)
VLDL Cholesterol Cal: 42 mg/dL — ABNORMAL HIGH (ref 5–40)

## 2021-01-23 LAB — COMPREHENSIVE METABOLIC PANEL
ALT: 26 IU/L (ref 0–44)
AST: 27 IU/L (ref 0–40)
Albumin/Globulin Ratio: 1.8 (ref 1.2–2.2)
Albumin: 4.6 g/dL (ref 3.8–4.8)
Alkaline Phosphatase: 63 IU/L (ref 44–121)
BUN/Creatinine Ratio: 10 (ref 10–24)
BUN: 15 mg/dL (ref 8–27)
Bilirubin Total: 0.7 mg/dL (ref 0.0–1.2)
CO2: 22 mmol/L (ref 20–29)
Calcium: 9 mg/dL (ref 8.6–10.2)
Chloride: 104 mmol/L (ref 96–106)
Creatinine, Ser: 1.45 mg/dL — ABNORMAL HIGH (ref 0.76–1.27)
Globulin, Total: 2.5 g/dL (ref 1.5–4.5)
Glucose: 77 mg/dL (ref 65–99)
Potassium: 4.2 mmol/L (ref 3.5–5.2)
Sodium: 139 mmol/L (ref 134–144)
Total Protein: 7.1 g/dL (ref 6.0–8.5)
eGFR: 54 mL/min/{1.73_m2} — ABNORMAL LOW (ref >59)

## 2021-01-23 LAB — CBC
Hematocrit: 49.5 % (ref 37.5–51.0)
Hemoglobin: 17.2 g/dL (ref 13.0–17.7)
MCH: 33.1 pg — ABNORMAL HIGH (ref 26.6–33.0)
MCHC: 34.7 g/dL (ref 31.5–35.7)
MCV: 95 fL (ref 79–97)
Platelets: 176 10*3/uL (ref 150–450)
RBC: 5.2 x10E6/uL (ref 4.14–5.80)
RDW: 12.8 % (ref 11.6–15.4)
WBC: 8.5 10*3/uL (ref 3.4–10.8)

## 2021-01-23 MED ORDER — ATORVASTATIN CALCIUM 10 MG PO TABS
10.0000 mg | ORAL_TABLET | Freq: Every day | ORAL | 3 refills | Status: DC
Start: 2021-01-23 — End: 2021-04-21

## 2021-01-23 NOTE — Progress Notes (Signed)
Let patient know that his blood cell counts including red blood cell, white blood cell and platelet counts are normal.  His kidney function is not 100% but stable compared to when it was checked 9 months ago.  He should avoid taking over-the-counter pain medications like ibuprofen, Aleve, Advil, Motrin and Naprosyn as these can make kidney function worse.  Okay to take Tylenol if needed.  Please return to the lab at his convenience to give a urine sample for [place] to further evaluate kidney function.  Liver function test is normal.  His LDL cholesterol is 137 with goal being less than 100.  High cholesterol can increase his risk for heart attack and strokes.  Healthy eating habits and trying to move as much as his breathing will allow can help to lower cholesterol.  I recommend starting a low-dose of a cholesterol-lowering medication called atorvastatin to help get his cholesterol under better control.  I have sent the prescription to his pharmacy. The rest of this is for my information. The 10-year ASCVD risk score Denman George DC Montez Hageman., et al., 2013) is: 16.5%   Values used to calculate the score:     Age: 65 years     Sex: Male     Is Non-Hispanic African American: No     Diabetic: No     Tobacco smoker: Yes     Systolic Blood Pressure: 125 mmHg     Is BP treated: No     HDL Cholesterol: 53 mg/dL     Total Cholesterol: 232 mg/dL

## 2021-01-27 ENCOUNTER — Telehealth: Payer: Self-pay

## 2021-01-27 NOTE — Telephone Encounter (Signed)
Contacted pt to go over lab results pt is aware and doesn't have any questions or concerns 

## 2021-02-03 DIAGNOSIS — Z1211 Encounter for screening for malignant neoplasm of colon: Secondary | ICD-10-CM | POA: Diagnosis not present

## 2021-02-11 LAB — COLOGUARD: Cologuard: POSITIVE — AB

## 2021-02-17 ENCOUNTER — Other Ambulatory Visit: Payer: Self-pay

## 2021-02-18 ENCOUNTER — Telehealth: Payer: Self-pay | Admitting: Internal Medicine

## 2021-02-18 DIAGNOSIS — R195 Other fecal abnormalities: Secondary | ICD-10-CM

## 2021-02-18 NOTE — Telephone Encounter (Signed)
Contacted pt to go over cologuard results pt didn't answer lvm

## 2021-02-18 NOTE — Telephone Encounter (Signed)
Medication Refill - Medication: loratadine   Has the patient contacted their pharmacy? No. Pt states that hs allergies have started acting up again and is requesting to have this sent over. Please advise.  (Agent: If no, request that the patient contact the pharmacy for the refill.) (Agent: If yes, when and what did the pharmacy advise?)  Preferred Pharmacy (with phone number or street name):  [id number] [place] [place] - [address]  [address] [place] [address]  Phone: [phone] Fax: [phone]  Hours: Not open 24 hours     Agent: Please be advised that RX refills may take up to 3 business days. We ask that you follow-up with your pharmacy.

## 2021-02-18 NOTE — Telephone Encounter (Signed)
-----   Message from Particia Lather, [place] sent at 02/17/2021  5:11 PM EDT ----- Regarding: cologuard Pt results are positive

## 2021-02-19 MED ORDER — LORATADINE 10 MG PO TABS: 10.0000 mg | ORAL_TABLET | Freq: Every day | ORAL | 3 refills | Status: DC | PRN

## 2021-02-19 NOTE — Telephone Encounter (Signed)
[facility]

## 2021-02-19 NOTE — Telephone Encounter (Signed)
Will forward to provider  

## 2021-02-26 NOTE — Telephone Encounter (Signed)
Will send letter.

## 2021-02-27 NOTE — Telephone Encounter (Signed)
Letter has been mailed out.

## 2021-04-03 DIAGNOSIS — F329 Major depressive disorder, single episode, unspecified: Secondary | ICD-10-CM | POA: Diagnosis not present

## 2021-04-03 DIAGNOSIS — F419 Anxiety disorder, unspecified: Secondary | ICD-10-CM | POA: Diagnosis not present

## 2021-04-21 ENCOUNTER — Other Ambulatory Visit: Payer: Self-pay | Admitting: Internal Medicine

## 2021-05-29 ENCOUNTER — Other Ambulatory Visit: Payer: Self-pay

## 2021-05-29 ENCOUNTER — Ambulatory Visit: Payer: Medicaid Other | Attending: Internal Medicine | Admitting: Internal Medicine

## 2021-05-29 DIAGNOSIS — Z880 Allergy status to penicillin: Secondary | ICD-10-CM | POA: Diagnosis not present

## 2021-05-29 DIAGNOSIS — Z7951 Long term (current) use of inhaled steroids: Secondary | ICD-10-CM | POA: Insufficient documentation

## 2021-05-29 DIAGNOSIS — F17210 Nicotine dependence, cigarettes, uncomplicated: Secondary | ICD-10-CM | POA: Diagnosis not present

## 2021-05-29 DIAGNOSIS — R195 Other fecal abnormalities: Secondary | ICD-10-CM | POA: Diagnosis not present

## 2021-05-29 DIAGNOSIS — Z23 Encounter for immunization: Secondary | ICD-10-CM | POA: Diagnosis not present

## 2021-05-29 DIAGNOSIS — J449 Chronic obstructive pulmonary disease, unspecified: Secondary | ICD-10-CM | POA: Insufficient documentation

## 2021-05-29 DIAGNOSIS — Z79899 Other long term (current) drug therapy: Secondary | ICD-10-CM | POA: Diagnosis not present

## 2021-05-29 DIAGNOSIS — Z720 Tobacco use: Secondary | ICD-10-CM

## 2021-05-29 NOTE — Progress Notes (Signed)
Patient ID: Isaac Harding, male    DOB: 02/02/1956  MRN: [id number]  CC: COPD   Subjective: Isaac Harding is a 65 y.o. male who presents for chronic ds management His concerns today include:  Patient with history of COPD, tobacco dependence, depression  Pt with positive Cologuard test.  I referred him for colonoscopy to GI.  Pt states he though about it and spoke to some nurses and friends who told him that a lot of people have polyps and it is no big deal.  So he has decided not to have the colonoscopy.  COPD: wants to know if he would qualify for O2.   Reports difficulty breathing when weather is hot.  Lives in air conditioned trailer. Using ProAir BID and Spiriva.  Thinks he is out of Symbicort for a while.  I RF it 01/2021 with 11 Rfs and looks like it is covered by his insurance. Reports a.m cough which he attributes to smoking.  States he has cut done but has not quite as yet.   C/o thick clear mucus when he blows his nose.  No sinus pressure.  No fever.  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     albuterol (VENTOLIN HFA) 108 (90 Base) MCG/ACT inhaler Inhale 1-2 puffs into the lungs every 6 (six) hours as needed for wheezing or shortness of breath. 18 g 11   atorvastatin (LIPITOR) 10 MG tablet TAKE 1 TABLET BY MOUTH EVERY DAY 90 tablet 0   budesonide-formoterol (SYMBICORT) 80-4.5 MCG/ACT inhaler Inhale 2 puffs into the lungs 2 (two) times daily. 1 each 11   busPIRone (BUSPAR) 7.5 MG tablet Take 7.5 mg by mouth 3 (three) times daily.     citalopram (CELEXA) 40 MG tablet Take 1 tablet (40 mg total) by mouth daily. 30 tablet  1   fluticasone (FLONASE) 50 MCG/ACT nasal spray Place 1 spray into both nostrils daily as needed for allergies or rhinitis. 16 g prn   loratadine (CLARITIN) 10 MG tablet Take 1 tablet (10 mg total) by mouth daily as needed for allergies. 30 tablet 3   nicotine (NICODERM CQ) 7 mg/24hr patch Place 1 patch (7 mg total) onto the skin daily. 28 patch 3   tiotropium (SPIRIVA HANDIHALER) 18 MCG inhalation capsule Place 1 capsule (18 mcg total) into inhaler and inhale daily. 30 capsule 6   No current facility-administered medications on file prior to visit.    Allergies  Allergen Reactions   Penicillins Itching    Social History   Socioeconomic History   Marital status: Divorced    Spouse name: Not on file   Number of children: Not on file   Years of education: Not on file   Highest education level: Not on file  Occupational History   Not on file  Tobacco Use   Smoking status: Every Day    Packs/day: 0.25    Pack years: 0.00    Types: Cigarettes   Smokeless tobacco: Never  Vaping Use   Vaping Use: Never used  Substance and Sexual Activity   Alcohol use: No   Drug use: Never  Sexual activity: Not on file  Other Topics Concern   Not on file  Social History Narrative   Not on file   Social Determinants of Health   Financial Resource Strain: Not on file  Food Insecurity: Not on file  Transportation Needs: Not on file  Physical Activity: Not on file  Stress: Not on file  Social Connections: Not on file  Intimate Partner Violence: Not on file    Family History  Problem Relation Age of Onset   Breast cancer Mother 62    Past Surgical History:  Procedure Laterality Date   NO PAST SURGERIES      ROS: Review of Systems Negative except as stated above  PHYSICAL EXAM: BP 126/76   Pulse 78   Ht 5\' 8"  (1.727 m)   Wt 185 lb 6.4 oz (84.1 kg)   SpO2 96%   BMI 28.19 kg/m   Physical Exam Pox 97% RA at rest. Pox 95-96 % with ambulation 1-1/2 times around the nursing  station.  General appearance - alert, well appearing, older Caucasian male and in no distress.  I have to talk in a loud voice as patient has decreased hearing. Mental status -patient is friendly and very talkative. Neck - supple, no significant adenopathy Chest -breath sounds mild to moderately decreased bilaterally.  No wheezes heard. Heart - normal rate, regular rhythm, normal S1, S2, no murmurs, rubs, clicks or gallops Extremities -  no lower extremity edema.  CMP Latest Ref Rng & Units 01/22/2021 04/14/2020 09/28/2018  Glucose 65 - 99 mg/dL 77 13/05/2018) 87  BUN 8 - 27 mg/dL 15 11 9   Creatinine 0.76 - 1.27 mg/dL 409(W) ) 1.19(J)  Sodium 134 - 144 mmol/L 139 139 142  Potassium 3.5 - 5.2 mmol/L 4.2 4.1 4.0  Chloride 96 - 106 mmol/L 104 101 103  CO2 20 - 29 mmol/L 22 22 23   Calcium 8.6 - 10.2 mg/dL 9.0 9.7 9.6  Total Protein 6.0 - 8.5 g/dL 7.1 7.2 7.3  Total Bilirubin 0.0 - 1.2 mg/dL 0.7 0.4 0.6  Alkaline Phos 44 - 121 IU/L 63 66 58  AST 0 - 40 IU/L 27 25 17   ALT 0 - 44 IU/L 26 19 11    Lipid Panel     Component Value Date/Time   CHOL 232 (H) 01/22/2021 1602   TRIG 233 (H) 01/22/2021 1602   HDL 53 01/22/2021 1602   CHOLHDL 4.4 01/22/2021 1602   LDLCALC 137 (H) 01/22/2021 1602    CBC    Component Value Date/Time   WBC 8.5 01/22/2021 1602   WBC 7.8 12/18/2015 1820   RBC 5.20 01/22/2021 1602   RBC 5.06 12/18/2015 1820   HGB 17.2 01/22/2021 1602   HCT 49.5 01/22/2021 1602   PLT 176 01/22/2021 1602   MCV 95 01/22/2021 1602   MCH 33.1 (H) 01/22/2021 1602   MCH 31.2 12/18/2015 1820   MCHC 34.7 01/22/2021 1602   MCHC 34.3 12/18/2015 1820   RDW 12.8 01/22/2021 1602   LYMPHSABS 2.2 09/28/2018 1622   MONOABS 0.4 12/18/2015 1820   EOSABS 0.0 09/28/2018 1622   BASOSABS 0.1 09/28/2018 1622    ASSESSMENT AND PLAN: 1. COPD with chronic bronchitis (HCC) Strongly advised to quit smoking. Encouraged him to use the Spiriva and Symbicort every day.  Informed him that he does  have refills on the Symbicort. He does not qualify for O2 based on oxygen saturations at rest and with ambulation.  2. Tobacco abuse Strongly advised him to quit  smoking.  I told him that it would help his breathing/lungs.  He does not seem ready to commit to quitting.  3. Positive colorectal cancer screening using Cologuard test Discussed the importance and significance of screening for early detection.  Encouraged him to have the colonoscopy done to make sure he does not have any cancerous or precancerous lesions in his colon.  Patient despite my advice decides that he does not want to have a colonoscopy done at this time.  4. Need for vaccination against Streptococcus pneumoniae Given Pneumovax 20.    Patient was given the opportunity to ask questions.  Patient verbalized understanding of the plan and was able to repeat key elements of the plan.   No orders of the defined types were placed in this encounter.    Requested Prescriptions    No prescriptions requested or ordered in this encounter    No follow-ups on file.  Jonah Blue, MD, FACP

## 2021-05-29 NOTE — Patient Instructions (Signed)
Please get a refill on your Symbicort inhaler.  You should use it twice a day every day as prescribed.

## 2021-06-12 ENCOUNTER — Other Ambulatory Visit: Payer: Self-pay | Admitting: Internal Medicine

## 2021-06-12 DIAGNOSIS — J309 Allergic rhinitis, unspecified: Secondary | ICD-10-CM

## 2021-06-12 MED ORDER — FLUTICASONE PROPIONATE 50 MCG/ACT NA SUSP: 1.0000 | Freq: Every day | NASAL | 2 refills | Status: DC | PRN

## 2021-06-12 NOTE — Telephone Encounter (Signed)
Medication Refill - Medication:   fluticasone (FLONASE) 50 MCG/ACT nasal spray   Has the patient contacted their pharmacy? Yes.  No refills.    Preferred Pharmacy (with phone number or street name):   [id number] [place], [place] - [address]  [address] [place] [address]  Phone: [phone] Fax: [phone]    Agent: Please be advised that RX refills may take up to 3 business days. We ask that you follow-up with your pharmacy.

## 2021-06-12 NOTE — Telephone Encounter (Signed)
  Notes to clinic:  Requested script has expired  Review for continued use and refill   Requested Prescriptions  Pending Prescriptions Disp Refills   fluticasone (FLONASE) 50 MCG/ACT nasal spray 16 g prn    Sig: Place 1 spray into both nostrils daily as needed for allergies or rhinitis.      Ear, Nose, and Throat: Nasal Preparations - Corticosteroids Passed - 06/12/2021 11:42 AM      Passed - Valid encounter within last 12 months    Recent Outpatient Visits           2 weeks ago COPD with chronic bronchitis (HCC)   [place] [facility] Marcine Matar, MD   4 months ago COPD with chronic bronchitis [facility])   [place] [facility] Marcine Matar, MD   1 year ago Tobacco abuse   [place] [facility] Hoy Register, MD   1 year ago COPD with chronic bronchitis [facility])   [place] [facility] Fulp, Russell, MD   2 years ago COPD with chronic bronchitis [facility])    [facility] Cain Saupe, MD       Future Appointments             In 3 months Laural Benes, Binnie Rail, MD [facility]

## 2021-06-22 ENCOUNTER — Other Ambulatory Visit: Payer: Self-pay | Admitting: Internal Medicine

## 2021-06-29 DIAGNOSIS — L989 Disorder of the skin and subcutaneous tissue, unspecified: Secondary | ICD-10-CM | POA: Diagnosis not present

## 2021-07-08 DIAGNOSIS — F419 Anxiety disorder, unspecified: Secondary | ICD-10-CM | POA: Diagnosis not present

## 2021-07-08 DIAGNOSIS — F329 Major depressive disorder, single episode, unspecified: Secondary | ICD-10-CM | POA: Diagnosis not present

## 2021-07-18 ENCOUNTER — Other Ambulatory Visit: Payer: Self-pay | Admitting: Internal Medicine

## 2021-10-02 ENCOUNTER — Ambulatory Visit: Payer: Medicaid Other | Admitting: Internal Medicine

## 2021-10-09 ENCOUNTER — Other Ambulatory Visit: Payer: Self-pay | Admitting: Internal Medicine

## 2021-10-09 DIAGNOSIS — J309 Allergic rhinitis, unspecified: Secondary | ICD-10-CM

## 2021-10-09 NOTE — Telephone Encounter (Signed)
Requested Prescriptions  Pending Prescriptions Disp Refills  . fluticasone (FLONASE) 50 MCG/ACT nasal spray [Pharmacy Med Name: FLUTICASONE PROP 50 MCG SPRAY] 16 mL 2    Sig: PLACE 1 SPRAY INTO BOTH NOSTRILS DAILY AS NEEDED FOR ALLERGIES OR RHINITIS.     Ear, Nose, and Throat: Nasal Preparations - Corticosteroids Passed - 10/09/2021  1:46 AM      Passed - Valid encounter within last 12 months    Recent Outpatient Visits          4 months ago COPD with chronic bronchitis (HCC)   [place] [facility] Marcine Matar, MD   8 months ago COPD with chronic bronchitis [facility])   [place] [facility] Marcine Matar, MD   1 year ago Tobacco abuse   [place] [facility] Hoy Register, MD   2 years ago COPD with chronic bronchitis (HCC)   [place] [facility] Fulp, [place], MD   3 years ago COPD with chronic bronchitis (HCC)   [place] [facility] Cain Saupe, MD      Future Appointments            In 1 month Laural Benes, Binnie Rail, MD [facility]

## 2021-10-12 DIAGNOSIS — F419 Anxiety disorder, unspecified: Secondary | ICD-10-CM | POA: Diagnosis not present

## 2021-10-12 DIAGNOSIS — F329 Major depressive disorder, single episode, unspecified: Secondary | ICD-10-CM | POA: Diagnosis not present

## 2021-10-29 DIAGNOSIS — J441 Chronic obstructive pulmonary disease with (acute) exacerbation: Secondary | ICD-10-CM | POA: Diagnosis not present

## 2021-11-30 ENCOUNTER — Ambulatory Visit: Payer: Self-pay

## 2021-11-30 NOTE — Telephone Encounter (Signed)
Pt is calling to ask what cough medication that he can take with his medications. Please advise    Chief Complaint: Productive cough, clear mucus Symptoms: Cough Frequency: Started 2 weeks. Has tried Mucinex. Pertinent Negatives: Patient denies fever Disposition: [] ED /[] Urgent Care (no appt availability in office) / [] Appointment(In office/virtual)/ []  [place] [facility]/ [] Home Care/ [] Refused Recommended Disposition /[] [place] [facility]/ []  Follow-up with PCP Additional Notes: Asking what he can take OTC. Please advise pt.   Answer Assessment - Initial Assessment Questions 1. NAME of MEDICATION: "What medicine are you calling about?"     Cough 2. QUESTION: "What is your question?" (e.g., double dose of medicine, side effect)     What he take for productive cough? 3. PRESCRIBING HCP: "Who prescribed it?" Reason: if prescribed by specialist, call should be referred to that group.     N/a 4. SYMPTOMS: "Do you have any symptoms?"     Productive cough 5. SEVERITY: If symptoms are present, ask "Are they mild, moderate or severe?"     Moderate.Has the cough x 2 weeks. 6. PREGNANCY:  "Is there any chance that you are pregnant?" "When was your last menstrual period?"     N/a  Protocols used: Medication Question Call-A-AH

## 2021-12-01 ENCOUNTER — Encounter: Payer: Self-pay | Admitting: Internal Medicine

## 2021-12-01 ENCOUNTER — Ambulatory Visit: Payer: Medicaid Other | Attending: Internal Medicine | Admitting: Internal Medicine

## 2021-12-01 ENCOUNTER — Telehealth: Payer: Self-pay

## 2021-12-01 DIAGNOSIS — Z79899 Other long term (current) drug therapy: Secondary | ICD-10-CM | POA: Diagnosis not present

## 2021-12-01 DIAGNOSIS — J449 Chronic obstructive pulmonary disease, unspecified: Secondary | ICD-10-CM | POA: Diagnosis not present

## 2021-12-01 DIAGNOSIS — E782 Mixed hyperlipidemia: Secondary | ICD-10-CM

## 2021-12-01 DIAGNOSIS — F17210 Nicotine dependence, cigarettes, uncomplicated: Secondary | ICD-10-CM | POA: Insufficient documentation

## 2021-12-01 DIAGNOSIS — Z720 Tobacco use: Secondary | ICD-10-CM | POA: Diagnosis not present

## 2021-12-01 DIAGNOSIS — F32A Depression, unspecified: Secondary | ICD-10-CM | POA: Insufficient documentation

## 2021-12-01 DIAGNOSIS — F419 Anxiety disorder, unspecified: Secondary | ICD-10-CM | POA: Insufficient documentation

## 2021-12-01 MED ORDER — VARENICLINE TARTRATE 0.5 MG X 11 & 1 MG X 42 PO TBPK
ORAL_TABLET | ORAL | 0 refills | Status: DC
Start: 2021-12-01 — End: 2022-03-03

## 2021-12-01 NOTE — Telephone Encounter (Signed)
Pt has been informed of upcoming appointment. °

## 2021-12-01 NOTE — Progress Notes (Signed)
Patient ID: Isaac Harding, male   DOB: 03/03/1956, 66 y.o.   MRN: [id number] Virtual Visit via Telephone Note  I connected with Isaac Harding on 12/01/2021 at 2:27 PM by telephone and verified that I am speaking with the correct person using two identifiers  Location: Patient: home Provider: office  Participants: Myself Patient    I discussed the limitations, risks, security and privacy concerns of performing an evaluation and management service by telephone and the availability of in person appointments. I also discussed with the patient that there may be a patient responsible charge related to this service. The patient expressed understanding and agreed to proceed.   History of Present Illness: Patient with history of COPD, tob dep, depression, pos cologuard but declines c-scope  COPD: reports coughing a little phlegm last week.  Took some OTC cough med with good results Reports compliance with Spiriva once a day and Symbicort BID.  Uses Ventolin inhaler but not every day. Uses it when he is exerting himself.  Had flu shot 07/2021 -smoking 4-5 cigarettes a day. Wants to quit more so due to cost.  "I got it in my mind to let um go."     HL: takes Lipitor "every once and a while."  No S.E that he recalls from the medication.  Still has a lot left.   Followed by Beverly Sessions for depression and anxiety.  Reports taking Buspar and Celexa 40mg .  reports he has been on these medications for years and his anxiety and depression are under good control.   Outpatient Encounter Medications as of 12/01/2021  Medication Sig   varenicline (CHANTIX PAK) 0.5 MG X 11 & 1 MG X 42 tablet Take one 0.5 mg tablet by mouth once daily for 3 days, then increase to one 0.5 mg tablet twice daily for 4 days, then increase to one 1 mg tablet twice daily.   acetaminophen (TYLENOL) 500 MG tablet Take 500 mg by mouth every 6 (six) hours as needed for headache.   albuterol (VENTOLIN HFA) 108 (90 Base) MCG/ACT inhaler  Inhale 1-2 puffs into the lungs every 6 (six) hours as needed for wheezing or shortness of breath.   atorvastatin (LIPITOR) 10 MG tablet TAKE 1 TABLET BY MOUTH EVERY DAY   budesonide-formoterol (SYMBICORT) 80-4.5 MCG/ACT inhaler Inhale 2 puffs into the lungs 2 (two) times daily.   busPIRone (BUSPAR) 7.5 MG tablet Take 7.5 mg by mouth 3 (three) times daily.   citalopram (CELEXA) 40 MG tablet Take 1 tablet (40 mg total) by mouth daily.   fluticasone (FLONASE) 50 MCG/ACT nasal spray PLACE 1 SPRAY INTO BOTH NOSTRILS DAILY AS NEEDED FOR ALLERGIES OR RHINITIS.   loratadine (CLARITIN) 10 MG tablet TAKE 1 TABLET BY MOUTH EVERY DAY AS NEEDED FOR ALLERGY   nicotine (NICODERM CQ) 7 mg/24hr patch Place 1 patch (7 mg total) onto the skin daily.   tiotropium (SPIRIVA HANDIHALER) 18 MCG inhalation capsule Place 1 capsule (18 mcg total) into inhaler and inhale daily.   No facility-administered encounter medications on file as of 12/01/2021.     Observations/Objective: Lab Results  Component Value Date   WBC 8.5 01/22/2021   HGB 17.2 01/22/2021   HCT 49.5 01/22/2021   MCV 95 01/22/2021   PLT 176 01/22/2021     Chemistry      Component Value Date/Time   NA 139 01/22/2021 1602   K 4.2 01/22/2021 1602   CL 104 01/22/2021 1602   CO2 22 01/22/2021 1602   BUN  15 01/22/2021 1602   CREATININE 1.45 (H) 01/22/2021 1602      Component Value Date/Time   CALCIUM 9.0 01/22/2021 1602   ALKPHOS 63 01/22/2021 1602   AST 27 01/22/2021 1602   ALT 26 01/22/2021 1602   BILITOT 0.7 01/22/2021 1602     Lab Results  Component Value Date   CHOL 232 (H) 01/22/2021   HDL 53 01/22/2021   LDLCALC 137 (H) 01/22/2021   TRIG 233 (H) 01/22/2021   CHOLHDL 4.4 01/22/2021     Assessment and Plan: 1. COPD with chronic bronchitis ([place]) Patient doing good on Spiriva and Symbicort.  He will continue these inhalers.  2. Tobacco abuse Pt is current smoker. Patient advised to quit smoking. Discussed health risks  associated with smoking including lung and other types of cancers, chronic lung diseases and CV risks.. Pt ready to give trail of quitting.   Discussed methods to help quit including quitting cold [place], use of NRT, Chantix and Bupropion.  Pt wanting to try: Chantix.  I think it is reasonable.  Advised that the medication can cause mood swings and bad dreams.  He reports that his depression and anxiety are under good control. _3_ Minutes spent on counseling. F/U: Reassess progress on subsequent visit.  - varenicline (CHANTIX PAK) 0.5 MG X 11 & 1 MG X 42 tablet; Take one 0.5 mg tablet by mouth once daily for 3 days, then increase to one 0.5 mg tablet twice daily for 4 days, then increase to one 1 mg tablet twice daily.  Dispense: 53 tablet; Refill: 0  3. Mixed hyperlipidemia Encouraged him to take the atorvastatin daily.  Discussed health risks associated with hyperlipidemia.   Follow Up Instructions: 4 mths   I discussed the assessment and treatment plan with the patient. The patient was provided an opportunity to ask questions and all were answered. The patient agreed with the plan and demonstrated an understanding of the instructions.   The patient was advised to call back or seek an in-person evaluation if the symptoms worsen or if the condition fails to improve as anticipated.  I  Spent 15 minutes on this telephone encounter  Karle Plumber, MD

## 2021-12-01 NOTE — Telephone Encounter (Signed)
-----   Message from Marcine Matar, MD sent at 12/01/2021  2:45 PM EST ----- Needs follow-up in person in 4 months.Marland Kitchen

## 2022-01-24 ENCOUNTER — Other Ambulatory Visit: Payer: Self-pay | Admitting: Internal Medicine

## 2022-01-25 NOTE — Telephone Encounter (Signed)
Requested medication (s) are due for refill today: yes ? ?Requested medication (s) are on the active medication list: yes ? ?Last refill:  07/18/21 #90/1 ? ?Future visit scheduled: yes ? ?Notes to clinic:  Unable to refill per protocol due to failed labs, no updated results. ? ? ? ?  ?Requested Prescriptions  ?Pending Prescriptions Disp Refills  ? atorvastatin (LIPITOR) 10 MG tablet [Pharmacy Med Name: ATORVASTATIN 10 MG TABLET] 90 tablet 1  ?  Sig: TAKE 1 TABLET BY MOUTH EVERY DAY  ?  ? Cardiovascular:  Antilipid - Statins Failed - 01/24/2022  1:15 AM  ?  ?  Failed - Lipid Panel in normal range within the last 12 months  ?  Cholesterol, Total  ?Date Value Ref Range Status  ?01/22/2021 232 (H) 100 - 199 mg/dL Final  ? ?LDL Chol Calc (NIH)  ?Date Value Ref Range Status  ?01/22/2021 137 (H) 0 - 99 mg/dL Final  ? ?HDL  ?Date Value Ref Range Status  ?01/22/2021 53 >39 mg/dL Final  ? ?Triglycerides  ?Date Value Ref Range Status  ?01/22/2021 233 (H) 0 - 149 mg/dL Final  ? ?  ?  ?  Passed - Patient is not pregnant  ?  ?  Passed - Valid encounter within last 12 months  ?  Recent Outpatient Visits   ? ?      ? 1 month ago COPD with chronic bronchitis (HCC)  ? [facility] Jonah Blue B, MD  ? 8 months ago COPD with chronic bronchitis (HCC)  ? [facility] Marcine Matar, MD  ? 1 year ago COPD with chronic bronchitis (HCC)  ? [facility] Marcine Matar, MD  ? 1 year ago Tobacco abuse  ? [facility] [place], Odette Horns, MD  ? 2 years ago COPD with chronic bronchitis (HCC)  ? [facility] Cain Saupe, MD  ? ?  ?  ?Future Appointments   ? ?        ? In 2 months Marcine Matar, MD [facility]  ? ?  ? ?  ?  ?  ? ?

## 2022-03-01 ENCOUNTER — Other Ambulatory Visit: Payer: Self-pay | Admitting: Internal Medicine

## 2022-03-01 DIAGNOSIS — Z720 Tobacco use: Secondary | ICD-10-CM

## 2022-03-01 DIAGNOSIS — J449 Chronic obstructive pulmonary disease, unspecified: Secondary | ICD-10-CM

## 2022-03-02 NOTE — Telephone Encounter (Signed)
Requested medications are due for refill today.  Unsure ? ?Requested medications are on the active medications list.  yes ? ?Last refill. 12/01/2021 ? ?Future visit scheduled.   yes ? ?Notes to clinic.  This refill request is for the starter pack. ? ? ? ?Requested Prescriptions  ?Pending Prescriptions Disp Refills  ? varenicline (CHANTIX PAK) 0.5 MG X 11 & 1 MG X 42 tablet [Pharmacy Med Name: VARENICLINE STARTING MONTH BOX] 53 each   ?  Sig: PLEASE SEE ATTACHED FOR DETAILED DIRECTIONS  ?  ? Psychiatry:  Drug Dependence Therapy - varenicline Failed - 03/01/2022  7:20 PM  ?  ?  Failed - Manual Review: Do not refill starter pack. 1 mg tabs may be extended up to one year if the patient has quit smoking but still feels at risk for relapse.  ?  ?  Failed - Cr in normal range and within 180 days  ?  Creatinine, Ser  ?Date Value Ref Range Status  ?01/22/2021 1.45 (H) 0.76 - 1.27 mg/dL Final  ?  ?  ?  ?  Passed - Completed PHQ-2 or PHQ-9 in the last 360 days  ?  ?  Passed - Valid encounter within last 6 months  ?  Recent Outpatient Visits   ? ?      ? 3 months ago COPD with chronic bronchitis ([place])  ? [place] Ladell Pier, MD  ? 9 months ago COPD with chronic bronchitis ([place])  ? [place] Ladell Pier, MD  ? 1 year ago COPD with chronic bronchitis ([place])  ? [place] Ladell Pier, MD  ? 1 year ago Tobacco abuse  ? [place], Charlane Ferretti, MD  ? 2 years ago COPD with chronic bronchitis ([place])  ? [place] Antony Blackbird, MD  ? ?  ?  ?Future Appointments   ? ?        ? In 1 month Ladell Pier, MD [place]  ? ?  ? ?  ?  ?  ?Signed Prescriptions Disp Refills  ? SYMBICORT 80-4.5 MCG/ACT inhaler 10.2 each 8  ?  Sig: INHALE 2 PUFFS INTO THE LUNGS TWICE A DAY  ?  ? Pulmonology:  Combination Products Passed - 03/01/2022   7:20 PM  ?  ?  Passed - Valid encounter within last 12 months  ?  Recent Outpatient Visits   ? ?      ? 3 months ago COPD with chronic bronchitis ([place])  ? [place] Ladell Pier, MD  ? 9 months ago COPD with chronic bronchitis ([place])  ? [place] Ladell Pier, MD  ? 1 year ago COPD with chronic bronchitis ([place])  ? [place] Ladell Pier, MD  ? 1 year ago Tobacco abuse  ? [place], Charlane Ferretti, MD  ? 2 years ago COPD with chronic bronchitis ([place])  ? [place] Antony Blackbird, MD  ? ?  ?  ?Future Appointments   ? ?        ? In 1 month Ladell Pier, MD [place]  ? ?  ? ?  ?  ?  ? atorvastatin (LIPITOR) 10 MG tablet 90 tablet 2  ?  Sig: TAKE 1 TABLET BY MOUTH EVERY DAY  ?  ? Cardiovascular:  Antilipid - Statins Failed - 03/01/2022  7:20 PM  ?  ?  Failed - Lipid Panel in normal range within the last 12 months  ?  Cholesterol, Total  ?Date Value Ref Range Status  ?01/22/2021 232 (H) 100 - 199 mg/dL Final  ? ?LDL Chol Calc (NIH)  ?Date Value Ref Range Status  ?01/22/2021 137 (H) 0 - 99 mg/dL Final  ? ?HDL  ?Date Value Ref Range Status  ?01/22/2021 53 >39 mg/dL Final  ? ?Triglycerides  ?Date Value Ref Range Status  ?01/22/2021 233 (H) 0 - 149 mg/dL Final  ? ?  ?  ?  Passed - Patient is not pregnant  ?  ?  Passed - Valid encounter within last 12 months  ?  Recent Outpatient Visits   ? ?      ? 3 months ago COPD with chronic bronchitis ([place])  ? [place] Ladell Pier, MD  ? 9 months ago COPD with chronic bronchitis ([place])  ? [place] Ladell Pier, MD  ? 1 year ago COPD with chronic bronchitis ([place])  ? [place] Ladell Pier, MD  ? 1 year ago Tobacco abuse  ? [place], Charlane Ferretti, MD  ? 2 years ago COPD with chronic bronchitis ([employer] [employer])  ? [place] Antony Blackbird, MD  ? ?  ?  ?Future Appointments   ? ?        ? In 1 month Ladell Pier, MD [place]  ? ?  ? ?  ?  ?  ? SPIRIVA HANDIHALER 18 MCG inhalation capsule 30 capsule 6  ?  Sig: INHALE 1 CAPSULE VIA HANDIHALER ONCE DAILY AT THE SAME TIME EVERY DAY  ?  ? Pulmonology:  Anticholinergic Agents Passed - 03/01/2022  7:20 PM  ?  ?  Passed - Valid encounter within last 12 months  ?  Recent Outpatient Visits   ? ?      ? 3 months ago COPD with chronic bronchitis ([place])  ? [place] Ladell Pier, MD  ? 9 months ago COPD with chronic bronchitis ([place])  ? [place] Ladell Pier, MD  ? 1 year ago COPD with chronic bronchitis ([place])  ? [place] Ladell Pier, MD  ? 1 year ago Tobacco abuse  ? [place], Charlane Ferretti, MD  ? 2 years ago COPD with chronic bronchitis ([place])  ? [place] Antony Blackbird, MD  ? ?  ?  ?Future Appointments   ? ?        ? In 1 month Ladell Pier, MD [place]  ? ?  ? ?  ?  ?  ?  ?

## 2022-03-02 NOTE — Telephone Encounter (Signed)
Requested Prescriptions  ?Pending Prescriptions Disp Refills  ?? SYMBICORT 80-4.5 MCG/ACT inhaler [Pharmacy Med Name: SYMBICORT 80-4.5 MCG INHALER] 10.2 each 8  ?  Sig: INHALE 2 PUFFS INTO THE LUNGS TWICE A DAY  ?  ? Pulmonology:  Combination Products Passed - 03/01/2022  7:20 PM  ?  ?  Passed - Valid encounter within last 12 months  ?  Recent Outpatient Visits   ?      ? 3 months ago COPD with chronic bronchitis (HCC)  ? [facility] Marcine Matar, MD  ? 9 months ago COPD with chronic bronchitis (HCC)  ? [facility] Marcine Matar, MD  ? 1 year ago COPD with chronic bronchitis (HCC)  ? [facility] Marcine Matar, MD  ? 1 year ago Tobacco abuse  ? [facility] [place], Odette Horns, MD  ? 2 years ago COPD with chronic bronchitis (HCC)  ? [facility] Cain Saupe, MD  ?  ?  ?Future Appointments   ?        ? In 1 month Marcine Matar, MD [facility]  ?  ? ?  ?  ?  ?? atorvastatin (LIPITOR) 10 MG tablet [Pharmacy Med Name: ATORVASTATIN 10 MG TABLET] 90 tablet 2  ?  Sig: TAKE 1 TABLET BY MOUTH EVERY DAY  ?  ? Cardiovascular:  Antilipid - Statins Failed - 03/01/2022  7:20 PM  ?  ?  Failed - Lipid Panel in normal range within the last 12 months  ?  Cholesterol, Total  ?Date Value Ref Range Status  ?01/22/2021 232 (H) 100 - 199 mg/dL Final  ? ?LDL Chol Calc (NIH)  ?Date Value Ref Range Status  ?01/22/2021 137 (H) 0 - 99 mg/dL Final  ? ?HDL  ?Date Value Ref Range Status  ?01/22/2021 53 >39 mg/dL Final  ? ?Triglycerides  ?Date Value Ref Range Status  ?01/22/2021 233 (H) 0 - 149 mg/dL Final  ? ?  ?  ?  Passed - Patient is not pregnant  ?  ?  Passed - Valid encounter within last 12 months  ?  Recent Outpatient Visits   ?      ? 3 months ago COPD with chronic bronchitis (HCC)  ? [facility] Marcine Matar, MD   ? 9 months ago COPD with chronic bronchitis (HCC)  ? [facility] Marcine Matar, MD  ? 1 year ago COPD with chronic bronchitis (HCC)  ? [facility] Marcine Matar, MD  ? 1 year ago Tobacco abuse  ? [facility] [place], Odette Horns, MD  ? 2 years ago COPD with chronic bronchitis (HCC)  ? [facility] Cain Saupe, MD  ?  ?  ?Future Appointments   ?        ? In 1 month Marcine Matar, MD [facility] And Wellness  ?  ? ?  ?  ?  ?? SPIRIVA HANDIHALER 18 MCG inhalation capsule [Pharmacy Med Name: SPIRIVA HANDIHALER 18 MCG CAP] 30 capsule 6  ?  Sig: INHALE 1 CAPSULE VIA HANDIHALER ONCE DAILY AT THE SAME TIME EVERY DAY  ?  ? Pulmonology:  Anticholinergic Agents Passed - 03/01/2022  7:20 PM  ?  ?  Passed - Valid encounter  within last 12 months  ?  Recent Outpatient Visits   ?      ? 3 months ago COPD with chronic bronchitis (HCC)  ? [facility] Marcine Matar, MD  ? 9 months ago COPD with chronic bronchitis (HCC)  ? [facility] Marcine Matar, MD  ? 1 year ago COPD with chronic bronchitis (HCC)  ? [facility] Marcine Matar, MD  ? 1 year ago Tobacco abuse  ? [facility] [place], Odette Horns, MD  ? 2 years ago COPD with chronic bronchitis (HCC)  ? [facility] Cain Saupe, MD  ?  ?  ?Future Appointments   ?        ? In 1 month Marcine Matar, MD [facility] And Wellness  ?  ? ?  ?  ?  ?? varenicline (CHANTIX PAK) 0.5 MG X 11 & 1 MG X 42 tablet [Pharmacy Med Name: VARENICLINE STARTING MONTH BOX] 53 each   ?  Sig: PLEASE SEE ATTACHED FOR DETAILED DIRECTIONS  ?  ? Psychiatry:  Drug Dependence Therapy - varenicline Failed - 03/01/2022  7:20 PM  ?  ?  Failed - Manual Review: Do not refill starter pack. 1 mg tabs may  be extended up to one year if the patient has quit smoking but still feels at risk for relapse.  ?  ?  Failed - Cr in normal range and within 180 days  ?  Creatinine, Ser  ?Date Value Ref Range Status  ?01/22/2021 1.45 (H) 0.76 - 1.27 mg/dL Final  ?   ?  ?  Passed - Completed PHQ-2 or PHQ-9 in the last 360 days  ?  ?  Passed - Valid encounter within last 6 months  ?  Recent Outpatient Visits   ?      ? 3 months ago COPD with chronic bronchitis (HCC)  ? [facility] Marcine Matar, MD  ? 9 months ago COPD with chronic bronchitis (HCC)  ? [facility] Wellness Marcine Matar, MD  ? 1 year ago COPD with chronic bronchitis (HCC)  ? [facility] Wellness Marcine Matar, MD  ? 1 year ago Tobacco abuse  ? [facility] And Wellness [place], Odette Horns, MD  ? 2 years ago COPD with chronic bronchitis (HCC)  ? [facility] And Wellness Cain Saupe, MD  ?  ?  ?Future Appointments   ?        ? In 1 month Marcine Matar, MD [facility] And Wellness  ?  ? ?  ?  ?  ? ?

## 2022-03-11 DIAGNOSIS — F329 Major depressive disorder, single episode, unspecified: Secondary | ICD-10-CM | POA: Diagnosis not present

## 2022-03-11 DIAGNOSIS — F419 Anxiety disorder, unspecified: Secondary | ICD-10-CM | POA: Diagnosis not present

## 2022-04-01 ENCOUNTER — Ambulatory Visit: Payer: Medicaid Other | Admitting: Internal Medicine

## 2022-04-22 ENCOUNTER — Ambulatory Visit: Payer: Medicaid Other | Attending: Internal Medicine | Admitting: Physician Assistant

## 2022-04-22 ENCOUNTER — Encounter: Payer: Self-pay | Admitting: Physician Assistant

## 2022-04-22 VITALS — BP 126/81 | HR 89 | Wt 180.0 lb

## 2022-04-22 DIAGNOSIS — R7989 Other specified abnormal findings of blood chemistry: Secondary | ICD-10-CM | POA: Diagnosis not present

## 2022-04-22 DIAGNOSIS — E782 Mixed hyperlipidemia: Secondary | ICD-10-CM | POA: Diagnosis not present

## 2022-04-22 DIAGNOSIS — H6981 Other specified disorders of Eustachian tube, right ear: Secondary | ICD-10-CM

## 2022-04-22 DIAGNOSIS — J309 Allergic rhinitis, unspecified: Secondary | ICD-10-CM | POA: Diagnosis not present

## 2022-04-22 DIAGNOSIS — J449 Chronic obstructive pulmonary disease, unspecified: Secondary | ICD-10-CM | POA: Diagnosis not present

## 2022-04-22 MED ORDER — FLUTICASONE PROPIONATE 50 MCG/ACT NA SUSP: 1.0000 | Freq: Every day | NASAL | 2 refills | Status: DC | PRN

## 2022-04-22 MED ORDER — SPIRIVA HANDIHALER 18 MCG IN CAPS: ORAL_CAPSULE | RESPIRATORY_TRACT | 6 refills | Status: DC

## 2022-04-22 MED ORDER — BUDESONIDE-FORMOTEROL FUMARATE 80-4.5 MCG/ACT IN AERO: INHALATION_SPRAY | RESPIRATORY_TRACT | 8 refills | Status: DC

## 2022-04-22 NOTE — Progress Notes (Signed)
Patient ID: Isaac Harding, male   DOB: April 28, 1956, 66 y.o.   MRN: [id number]   Savon Bordonaro, is a 66 y.o. male  EHO:[id number]  [id number]  DOB - 11-18-1956  Chief Complaint  Patient presents with   Ear Fullness    Right ear         Subjective:   Isaac Harding is a 66 y.o. male here today for R ear pain and pressure.  Also needs RF on a few medications.  Ear has been bothering him about 2 weeks.  He is not using flonase.  No fever or other URI s/sx.    Seen at [facility] for [facility].  Stable on meds.  No SI/HI.    No problems updated.  ALLERGIES: Allergies  Allergen Reactions   Penicillins Itching    PAST MEDICAL HISTORY: Past Medical History:  Diagnosis Date   COPD with chronic bronchitis (HCC)    Depression     MEDICATIONS AT HOME: Prior to Admission medications   Medication Sig Start Date End Date Taking? Authorizing Provider  acetaminophen (TYLENOL) 500 MG tablet Take 500 mg by mouth every 6 (six) hours as needed for headache.    [provider]  albuterol (VENTOLIN HFA) 108 (90 Base) MCG/ACT inhaler Inhale 1-2 puffs into the lungs every 6 (six) hours as needed for wheezing or shortness of breath. 01/22/21   Marcine Matar, MD  atorvastatin (LIPITOR) 10 MG tablet TAKE 1 TABLET BY MOUTH EVERY DAY 03/02/22   Marcine Matar, MD  budesonide-formoterol [facility]) 80-4.5 MCG/ACT inhaler INHALE 2 PUFFS INTO THE LUNGS TWICE A DAY 04/22/22   Shunna Mikaelian, Marzella Schlein, PA-C  busPIRone (BUSPAR) 7.5 MG tablet Take 7.5 mg by mouth 3 (three) times daily. 12/25/20   [provider]  citalopram (CELEXA) 40 MG tablet Take 1 tablet (40 mg total) by mouth daily. 09/28/18   Fulp, Cammie, MD  fluticasone (FLONASE) 50 MCG/ACT nasal spray Place 1 spray into both nostrils daily as needed for allergies or rhinitis. 04/22/22   Anders Simmonds, PA-C  loratadine (CLARITIN) 10 MG tablet TAKE 1 TABLET BY MOUTH EVERY DAY AS NEEDED FOR ALLERGY 06/22/21   Marcine Matar, MD  tiotropium  (SPIRIVA HANDIHALER) 18 MCG inhalation capsule INHALE 1 CAPSULE VIA HANDIHALER ONCE DAILY AT THE SAME TIME EVERY DAY 04/22/22   Anders Simmonds, PA-C  varenicline (CHANTIX) 1 MG tablet Take 1 tablet (1 mg total) by mouth 2 (two) times daily. 03/03/22   Marcine Matar, MD    ROS: Neg resp Neg cardiac Neg GI Neg GU Neg MS Neg psych Neg neuro  Objective:   Vitals:   04/22/22 1011  BP: 126/81  Pulse: 89  SpO2: 97%  Weight: 180 lb (81.6 kg)   Exam General appearance : Awake, alert, not in any distress. Speech Clear. Not toxic looking HEENT: Atraumatic and Normocephalic R TM full and dull with out erythema.  L TM WNL Neck: Supple, no JVD. No cervical lymphadenopathy.  Chest: Good air entry bilaterally, CTAB.  No rales/rhonchi/wheezing CVS: S1 S2 regular, no murmurs.  Extremities: B/L Lower Ext shows no edema, both legs are warm to touch Neurology: Awake alert, and oriented X 3, CN II-XII intact, Non focal Skin: No Rash  Data Review No results found for: HGBA1C  Assessment & Plan   1. Elevated serum creatinine Drink adequate water - Basic metabolic panel  2. Mixed hyperlipidemia Continue atorvastatin  3. COPD with chronic bronchitis (HCC) - tiotropium (SPIRIVA HANDIHALER) 18 MCG  inhalation capsule; INHALE 1 CAPSULE VIA HANDIHALER ONCE DAILY AT THE SAME TIME EVERY DAY  Dispense: 30 capsule; Refill: 6 - budesonide-formoterol (SYMBICORT) 80-4.5 MCG/ACT inhaler; INHALE 2 PUFFS INTO THE LUNGS TWICE A DAY  Dispense: 10.2 each; Refill: 8  4. Allergic rhinitis, unspecified seasonality, unspecified trigger - fluticasone (FLONASE) 50 MCG/ACT nasal spray; Place 1 spray into both nostrils daily as needed for allergies or rhinitis.  Dispense: 16 mL; Refill: 2  5. Dysfunction of right eustachian tube - Basic metabolic panel - fluticasone (FLONASE) 50 MCG/ACT nasal spray; Place 1 spray into both nostrils daily as needed for allergies or rhinitis.  Dispense: 16 mL; Refill:  2     Return in about 6 months (around 10/22/2022) for PCP for chronic conditions .  The patient was given clear instructions to go to ER or return to medical center if symptoms don't improve, worsen or new problems develop. The patient verbalized understanding. The patient was told to call to get lab results if they haven't heard anything in the next week.      [place]ian Co, PA-C [facility] [place], [place] [phone]   04/22/2022, 10:21 AM

## 2022-04-22 NOTE — Patient Instructions (Signed)
Eustachian Tube Dysfunction  Eustachian tube dysfunction refers to a condition in which a blockage develops in the narrow passage that connects the middle ear to the back of the nose (eustachian tube). The eustachian tube regulates air pressure in the middle ear by letting air move between the ear and nose. It also helps to drain fluid from the middle ear space. Eustachian tube dysfunction can affect one or both ears. When the eustachian tube does not function properly, air pressure, fluid, or both can build up in the middle ear. What are the causes? This condition occurs when the eustachian tube becomes blocked or cannot open normally. Common causes of this condition include: Ear infections. Colds and other infections that affect the nose, mouth, and throat (upper respiratory tract). Allergies. Irritation from cigarette smoke. Irritation from stomach acid coming up into the esophagus (gastroesophageal reflux). The esophagus is the part of the body that moves food from the mouth to the stomach. Sudden changes in air pressure, such as from descending in an airplane or scuba diving. Abnormal growths in the nose or throat, such as: Growths that line the nose (nasal polyps). Abnormal growth of cells (tumors). Enlarged tissue at the back of the throat (adenoids). What increases the risk? You are more likely to develop this condition if: You smoke. You are overweight. You are a child who has: Certain birth defects of the mouth, such as cleft palate. Large tonsils or adenoids. What are the signs or symptoms? Common symptoms of this condition include: A feeling of fullness in the ear. Ear pain. Clicking or popping noises in the ear. Ringing in the ear (tinnitus). Hearing loss. Loss of balance. Dizziness. Symptoms may get worse when the air pressure around you changes, such as when you travel to an area of high elevation, fly on an airplane, or go scuba diving. How is this diagnosed? This  condition may be diagnosed based on: Your symptoms. A physical exam of your ears, nose, and throat. Tests, such as those that measure: The movement of your eardrum. Your hearing (audiometry). How is this treated? Treatment depends on the cause and severity of your condition. In mild cases, you may relieve your symptoms by moving air into your ears. This is called "popping the ears." In more severe cases, or if you have symptoms of fluid in your ears, treatment may include: Medicines to relieve congestion (decongestants). Medicines that treat allergies (antihistamines). Nasal sprays or ear drops that contain medicines that reduce swelling (steroids). A procedure to drain the fluid in your eardrum. In this procedure, a small tube may be placed in the eardrum to: Drain the fluid. Restore the air in the middle ear space. A procedure to insert a balloon device through the nose to inflate the opening of the eustachian tube (balloon dilation). Follow these instructions at home: Lifestyle Do not do any of the following until your health care provider approves: Travel to high altitudes. Fly in airplanes. Work in a pressurized cabin or room. Scuba dive. Do not use any products that contain nicotine or tobacco. These products include cigarettes, chewing tobacco, and vaping devices, such as e-cigarettes. If you need help quitting, ask your health care provider. Keep your ears dry. Wear fitted earplugs during showering and bathing. Dry your ears completely after. General instructions Take over-the-counter and prescription medicines only as told by your health care provider. Use techniques to help pop your ears as recommended by your health care provider. These may include: Chewing gum. Yawning. Frequent, forceful swallowing.   Closing your mouth, holding your nose closed, and gently blowing as if you are trying to blow air out of your nose. Keep all follow-up visits. This is important. Contact a  health care provider if: Your symptoms do not go away after treatment. Your symptoms come back after treatment. You are unable to pop your ears. You have: A fever. Pain in your ear. Pain in your head or neck. Fluid draining from your ear. Your hearing suddenly changes. You become very dizzy. You lose your balance. Get help right away if: You have a sudden, severe increase in any of your symptoms. Summary Eustachian tube dysfunction refers to a condition in which a blockage develops in the eustachian tube. It can be caused by ear infections, allergies, inhaled irritants, or abnormal growths in the nose or throat. Symptoms may include ear pain or fullness, hearing loss, or ringing in the ears. Mild cases are treated with techniques to unblock the ears, such as yawning or chewing gum. More severe cases are treated with medicines or procedures. This information is not intended to replace advice given to you by your health care provider. Make sure you discuss any questions you have with your health care provider. Document Revised: 01/19/2021 Document Reviewed: 01/19/2021 Elsevier Patient Education  2023 Elsevier Inc.  

## 2022-04-23 LAB — BASIC METABOLIC PANEL
BUN/Creatinine Ratio: 8 — ABNORMAL LOW (ref 10–24)
BUN: 12 mg/dL (ref 8–27)
CO2: 24 mmol/L (ref 20–29)
Calcium: 9.6 mg/dL (ref 8.6–10.2)
Chloride: 102 mmol/L (ref 96–106)
Creatinine, Ser: 1.56 mg/dL — ABNORMAL HIGH (ref 0.76–1.27)
Glucose: 86 mg/dL (ref 70–99)
Potassium: 3.9 mmol/L (ref 3.5–5.2)
Sodium: 140 mmol/L (ref 134–144)
eGFR: 49 mL/min/{1.73_m2} — ABNORMAL LOW (ref >59)

## 2022-07-31 ENCOUNTER — Other Ambulatory Visit: Payer: Self-pay | Admitting: Physician Assistant

## 2022-07-31 DIAGNOSIS — H6981 Other specified disorders of Eustachian tube, right ear: Secondary | ICD-10-CM

## 2022-07-31 DIAGNOSIS — J309 Allergic rhinitis, unspecified: Secondary | ICD-10-CM

## 2022-08-02 ENCOUNTER — Other Ambulatory Visit: Payer: Self-pay

## 2022-08-26 ENCOUNTER — Other Ambulatory Visit: Payer: Self-pay | Admitting: Internal Medicine

## 2022-08-26 ENCOUNTER — Ambulatory Visit (HOSPITAL_BASED_OUTPATIENT_CLINIC_OR_DEPARTMENT_OTHER): Payer: 59 | Admitting: Internal Medicine

## 2022-08-26 DIAGNOSIS — J4489 Other specified chronic obstructive pulmonary disease: Secondary | ICD-10-CM

## 2022-08-26 DIAGNOSIS — F17210 Nicotine dependence, cigarettes, uncomplicated: Secondary | ICD-10-CM | POA: Diagnosis not present

## 2022-08-26 DIAGNOSIS — N1831 Chronic kidney disease, stage 3a: Secondary | ICD-10-CM | POA: Diagnosis not present

## 2022-08-26 DIAGNOSIS — H6991 Unspecified Eustachian tube disorder, right ear: Secondary | ICD-10-CM

## 2022-08-26 DIAGNOSIS — Z5320 Procedure and treatment not carried out because of patient's decision for unspecified reasons: Secondary | ICD-10-CM

## 2022-08-26 DIAGNOSIS — E782 Mixed hyperlipidemia: Secondary | ICD-10-CM | POA: Diagnosis not present

## 2022-08-26 DIAGNOSIS — J309 Allergic rhinitis, unspecified: Secondary | ICD-10-CM

## 2022-08-26 NOTE — Progress Notes (Signed)
Patient ID: Isaac Harding, male   DOB: Nov 07, 1956, 66 y.o.   MRN: [id number] Virtual Visit via Telephone Note  I connected with Isaac Harding on 08/26/2022 at 10:25 a.m by telephone and verified that I am speaking with the correct person using two identifiers This was suppose to be an in-person visit but I was informed by front desk that pt called this a.m and requested change to telephone visit.   Location: Patient: home Provider: office  Participants: Myself Patient    I discussed the limitations, risks, security and privacy concerns of performing an evaluation and management service by telephone and the availability of in person appointments. I also discussed with the patient that there may be a patient responsible charge related to this service. The patient expressed understanding and agreed to proceed.   History of Present Illness: Patient with history of COPD, tob dep, depression, pos cologuard but declines c-scope  Got Medicare through [facility] that stared 08/22/2022  COPD:  reports compliance with Symbicort BID and Spiriva once a day.  Uses Ventolin with exertion. No increase cough recently. SOB only with exertion and in humid weather. Still smoking but states he is getting ready to quit cold [place] and save his money.  Does not recall if he got the Chantix that was prescribed on last visit.  Reports using nicotine patches in past but too many S.E Received Flu shot recently at [facility].  Plans to get COVID booster.   HL:  thinks he has Lipitor but not sure.    CKD 3: GFR has ranged between 49-58 over the past 3 years.  Not on any NSAIDs.  Looks like he is also seen at [facility] at times for primary care.  He tells me that he plans to stick with [place].  Declines referral for colonoscopy given hx of pos cologuard.       Outpatient Encounter Medications as of 08/26/2022  Medication Sig   acetaminophen (TYLENOL) 500 MG tablet Take 500 mg by mouth every 6 (six) hours as  needed for headache.   albuterol (VENTOLIN HFA) 108 (90 Base) MCG/ACT inhaler Inhale 1-2 puffs into the lungs every 6 (six) hours as needed for wheezing or shortness of breath.   atorvastatin (LIPITOR) 10 MG tablet TAKE 1 TABLET BY MOUTH EVERY DAY   budesonide-formoterol (SYMBICORT) 80-4.5 MCG/ACT inhaler INHALE 2 PUFFS INTO THE LUNGS TWICE A DAY   busPIRone (BUSPAR) 7.5 MG tablet Take 7.5 mg by mouth 3 (three) times daily.   citalopram (CELEXA) 40 MG tablet Take 1 tablet (40 mg total) by mouth daily.   fluticasone (FLONASE) 50 MCG/ACT nasal spray PLACE 1 SPRAY INTO BOTH NOSTRILS DAILY AS NEEDED FOR ALLERGIES OR RHINITIS.   loratadine (CLARITIN) 10 MG tablet TAKE 1 TABLET BY MOUTH EVERY DAY AS NEEDED FOR ALLERGY   tiotropium (SPIRIVA HANDIHALER) 18 MCG inhalation capsule INHALE 1 CAPSULE VIA HANDIHALER ONCE DAILY AT THE SAME TIME EVERY DAY   varenicline (CHANTIX) 1 MG tablet Take 1 tablet (1 mg total) by mouth 2 (two) times daily.   No facility-administered encounter medications on file as of 08/26/2022.      Observations/Objective: No direct observation done as this was a telephone visit.  Assessment and Plan: 1. COPD with chronic bronchitis Stable on Spiriva and Symbicort.  2. Tobacco abuse Strongly encouraged him to quit smoking.  He tells me he plans to quit cold [place].  3. Mixed hyperlipidemia Encouraged him to take the atorvastatin as prescribed.  4. Stage  3a chronic kidney disease ([place]) Advised to avoid NSAIDs.  Drink adequate fluids during the day.  5. Colon cancer screening declined Recommend referral for colonoscopy.  Patient declines.   Follow Up Instructions: 3 mths   I discussed the assessment and treatment plan with the patient. The patient was provided an opportunity to ask questions and all were answered. The patient agreed with the plan and demonstrated an understanding of the instructions.   The patient was advised to call back or seek an in-person evaluation  if the symptoms worsen or if the condition fails to improve as anticipated.  I  Spent 15 minutes on this telephone encounter  This note has been created with Surveyor, quantity. Any transcriptional errors are unintentional.  Isaac Plumber, MD

## 2022-08-26 NOTE — Telephone Encounter (Signed)
Requested medication (s) are due for refill today: yes  Requested medication (s) are on the active medication list: yes  Last refill:  08/02/22 16 ml  Future visit scheduled: yes  Notes to clinic:  Pharmacy comment: [place]. DX Code Needed   Requested Prescriptions  Pending Prescriptions Disp Refills   fluticasone (FLONASE) 50 MCG/ACT nasal spray [Pharmacy Med Name: FLUTICASONE PROP 50 MCG SPRAY] 48 mL 1    Sig: PLACE 1 SPRAY INTO BOTH NOSTRILS DAILY AS NEEDED FOR ALLERGIES OR RHINITIS.     Ear, Nose, and Throat: Nasal Preparations - Corticosteroids Passed - 08/26/2022  8:32 AM      Passed - Valid encounter within last 12 months    Recent Outpatient Visits           4 months ago Elevated serum creatinine   [place], [place], [place]   8 months ago COPD with chronic bronchitis [facility])   [place] Ladell [place], MD   1 year ago COPD with chronic bronchitis [facility])   [place], MD   1 year ago COPD with chronic bronchitis [facility])   [place], Deborah B, MD   2 years ago Tobacco abuse   [place], [place], MD       Future Appointments             In 3 months Wynetta Emery Dalbert Batman, MD [place]

## 2022-11-30 ENCOUNTER — Ambulatory Visit: Payer: Medicare Other | Admitting: Internal Medicine

## 2023-01-03 ENCOUNTER — Other Ambulatory Visit: Payer: Self-pay | Admitting: Internal Medicine

## 2023-01-03 NOTE — Telephone Encounter (Signed)
Requested medications are due for refill today.  yes  Requested medications are on the active medications list.  yes  Last refill. 03/02/2022 #90 2 rf  Future visit scheduled.   yes  Notes to clinic.  Labs are expired.    Requested Prescriptions  Pending Prescriptions Disp Refills   atorvastatin (LIPITOR) 10 MG tablet [Pharmacy Med Name: ATORVASTATIN 10 MG TABLET] 90 tablet 2    Sig: TAKE 1 TABLET BY MOUTH EVERY DAY     Cardiovascular:  Antilipid - Statins Failed - 01/03/2023  1:31 AM      Failed - Lipid Panel in normal range within the last 12 months    Cholesterol, Total  Date Value Ref Range Status  01/22/2021 232 (H) 100 - 199 mg/dL Final   LDL Chol Calc (NIH)  Date Value Ref Range Status  01/22/2021 137 (H) 0 - 99 mg/dL Final   HDL  Date Value Ref Range Status  01/22/2021 53 >39 mg/dL Final   Triglycerides  Date Value Ref Range Status  01/22/2021 233 (H) 0 - 149 mg/dL Final         Passed - Patient is not pregnant      Passed - Valid encounter within last 12 months    Recent Outpatient Visits           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

## 2023-01-14 ENCOUNTER — Other Ambulatory Visit: Payer: Self-pay | Admitting: Internal Medicine

## 2023-01-14 NOTE — Telephone Encounter (Signed)
Rx 01/04/23 #90- too soon Requested Prescriptions  Pending Prescriptions Disp Refills   atorvastatin (LIPITOR) 10 MG tablet [Pharmacy Med Name: ATORVASTATIN 10 MG TABLET] 90 tablet 0    Sig: TAKE 1 TABLET BY MOUTH EVERY DAY     Cardiovascular:  Antilipid - Statins Failed - 01/14/2023 11:31 AM      Failed - Lipid Panel in normal range within the last 12 months    Cholesterol, Total  Date Value Ref Range Status  01/22/2021 232 (H) 100 - 199 mg/dL Final   LDL Chol Calc (NIH)  Date Value Ref Range Status  01/22/2021 137 (H) 0 - 99 mg/dL Final   HDL  Date Value Ref Range Status  01/22/2021 53 >39 mg/dL Final   Triglycerides  Date Value Ref Range Status  01/22/2021 233 (H) 0 - 149 mg/dL Final         Passed - Patient is not pregnant      Passed - Valid encounter within last 12 months    Recent Outpatient Visits           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

## 2023-02-07 ENCOUNTER — Ambulatory Visit: Payer: Medicare Other | Admitting: Internal Medicine

## 2023-04-16 ENCOUNTER — Other Ambulatory Visit: Payer: Self-pay | Admitting: Internal Medicine

## 2023-04-19 NOTE — Telephone Encounter (Signed)
Requested Prescriptions  Pending Prescriptions Disp Refills   atorvastatin (LIPITOR) 10 MG tablet [Pharmacy Med Name: ATORVASTATIN 10 MG TABLET] 90 tablet 0    Sig: TAKE 1 TABLET BY MOUTH EVERY DAY     Cardiovascular:  Antilipid - Statins Failed - 04/16/2023  8:52 AM      Failed - Lipid Panel in normal range within the last 12 months    Cholesterol, Total  Date Value Ref Range Status  01/22/2021 232 (H) 100 - 199 mg/dL Final   LDL Chol Calc (NIH)  Date Value Ref Range Status  01/22/2021 137 (H) 0 - 99 mg/dL Final   HDL  Date Value Ref Range Status  01/22/2021 53 >39 mg/dL Final   Triglycerides  Date Value Ref Range Status  01/22/2021 233 (H) 0 - 149 mg/dL Final         Passed - Patient is not pregnant      Passed - Valid encounter within last 12 months    Recent Outpatient Visits           7 months ago COPD with chronic bronchitis   [place] [facility] Matar, MD   12 months ago Elevated serum creatinine   [place] [facility] [place], [place], [place]   1 year ago COPD with chronic bronchitis [facility])   [place] [facility] Marcine Matar, MD   1 year ago COPD with chronic bronchitis [facility])   [place] [facility] Marcine Matar, MD   2 years ago COPD with chronic bronchitis [facility])   [place] [facility] Marcine Matar, MD       Future Appointments             In 1 month [place], Binnie Rail, MD [facility]

## 2023-04-21 ENCOUNTER — Other Ambulatory Visit: Payer: Self-pay | Admitting: Internal Medicine

## 2023-04-21 DIAGNOSIS — J4489 Other specified chronic obstructive pulmonary disease: Secondary | ICD-10-CM

## 2023-04-22 NOTE — Telephone Encounter (Signed)
Requested Prescriptions  Pending Prescriptions Disp Refills   SPIRIVA HANDIHALER 18 MCG inhalation capsule [Pharmacy Med Name: SPIRIVA HANDIHALER 18 MCG CAP] 30 capsule 2    Sig: INHALE 1 CAPSULE VIA HANDIHALER ONCE DAILY AT THE SAME TIME EVERY DAY     Pulmonology:  Anticholinergic Agents Failed - 04/21/2023  5:53 PM      Failed - Valid encounter within last 12 months    Recent Outpatient Visits           7 months ago COPD with chronic bronchitis   [place] [facility] Marcine Matar, MD   1 year ago Elevated serum creatinine   [place] [facility] [place], [place], [place]   1 year ago COPD with chronic bronchitis [facility])   [place] [facility] Marcine Matar, MD   1 year ago COPD with chronic bronchitis [facility])   [place] [facility] Marcine Matar, MD   2 years ago COPD with chronic bronchitis [facility])   [place] [facility] Marcine Matar, MD       Future Appointments             In 1 month Laural Benes, Binnie Rail, MD [facility]

## 2023-05-24 ENCOUNTER — Ambulatory Visit: Payer: Medicare Other | Admitting: Internal Medicine

## 2023-06-03 ENCOUNTER — Other Ambulatory Visit: Payer: Self-pay | Admitting: Physician Assistant

## 2023-06-03 DIAGNOSIS — J4489 Other specified chronic obstructive pulmonary disease: Secondary | ICD-10-CM

## 2023-06-11 ENCOUNTER — Other Ambulatory Visit: Payer: Self-pay | Admitting: Internal Medicine

## 2023-06-11 DIAGNOSIS — H6991 Unspecified Eustachian tube disorder, right ear: Secondary | ICD-10-CM

## 2023-06-11 DIAGNOSIS — J309 Allergic rhinitis, unspecified: Secondary | ICD-10-CM

## 2023-06-13 NOTE — Telephone Encounter (Signed)
Future OV scheduled.  Requested Prescriptions  Pending Prescriptions Disp Refills   fluticasone (FLONASE) 50 MCG/ACT nasal spray [Pharmacy Med Name: FLUTICASONE PROP 50 MCG SPRAY] 48 mL 0    Sig: PLACE 1 SPRAY INTO BOTH NOSTRILS DAILY AS NEEDED FOR ALLERGIES OR RHINITIS.     Ear, Nose, and Throat: Nasal Preparations - Corticosteroids Failed - 06/11/2023  2:36 PM      Failed - Valid encounter within last 12 months    Recent Outpatient Visits           9 months ago COPD with chronic bronchitis   [place] [facility]ar, MD   1 year ago Elevated serum creatinine   [facility] [place], [place], [place]   1 year ago COPD with chronic bronchitis [facility])   [facility] Marcine Matar, MD   2 years ago COPD with chronic bronchitis [facility])   [place] [facility] [facility] Marcine Matar, MD   2 years ago COPD with chronic bronchitis [facility])   [place] [facility] Marcine Matar, MD       Future Appointments             In 2 months Laural Benes Binnie Rail, MD [facility]

## 2023-07-09 ENCOUNTER — Other Ambulatory Visit: Payer: Self-pay | Admitting: Internal Medicine

## 2023-07-12 NOTE — Telephone Encounter (Signed)
Requested medication (s) are due for refill today: yes  Requested medication (s) are on the active medication list: yes  Last refill:  04/19/23  Future visit scheduled: yes  Notes to clinic:  Unable to refill per protocol due to failed labs, no updated lipid results.       Requested Prescriptions  Pending Prescriptions Disp Refills   atorvastatin (LIPITOR) 10 MG tablet [Pharmacy Med Name: ATORVASTATIN 10 MG TABLET] 90 tablet 0    Sig: TAKE 1 TABLET BY MOUTH EVERY DAY     Cardiovascular:  Antilipid - Statins Failed - 07/09/2023  4:54 PM      Failed - Valid encounter within last 12 months    Recent Outpatient Visits           10 months ago COPD with chronic bronchitis   [place] [facility] Marcine Matar, MD   1 year ago Elevated serum creatinine   [place] [facility] [place], [place], [place]   1 year ago COPD with chronic bronchitis [facility])   [place] [facility] Jonah Blue B, MD   2 years ago COPD with chronic bronchitis [facility])   [place] [facility] Marcine Matar, MD   2 years ago COPD with chronic bronchitis [facility])   [place] [facility] Marcine Matar, MD       Future Appointments             In 1 month Marcine Matar, MD [place] [facility]            Failed - Lipid Panel in normal range within the last 12 months    Cholesterol, Total  Date Value Ref Range Status  01/22/2021 232 (H) 100 - 199 mg/dL Final   LDL Chol Calc (NIH)  Date Value Ref Range Status  01/22/2021 137 (H) 0 - 99 mg/dL Final   HDL  Date Value Ref Range Status  01/22/2021 53 >39 mg/dL Final   Triglycerides  Date Value Ref Range Status  01/22/2021 233 (H) 0 - 149 mg/dL Final         Passed - Patient is not pregnant

## 2023-08-04 ENCOUNTER — Other Ambulatory Visit: Payer: Self-pay | Admitting: Internal Medicine

## 2023-08-04 DIAGNOSIS — E782 Mixed hyperlipidemia: Secondary | ICD-10-CM

## 2023-08-12 ENCOUNTER — Ambulatory Visit: Payer: Medicaid Other | Admitting: Internal Medicine

## 2023-08-20 ENCOUNTER — Other Ambulatory Visit: Payer: Self-pay | Admitting: Internal Medicine

## 2023-08-22 NOTE — Telephone Encounter (Signed)
Pt no longer under prescribers care /pt moved  Requested Prescriptions  Refused Prescriptions Disp Refills   atorvastatin (LIPITOR) 10 MG tablet [Pharmacy Med Name: ATORVASTATIN 10 MG TABLET] 30 tablet 0    Sig: TAKE 1 TABLET BY MOUTH EVERY DAY     Cardiovascular:  Antilipid - Statins Failed - 08/20/2023  4:16 PM      Failed - Valid encounter within last 12 months    Recent Outpatient Visits           12 months ago COPD with chronic bronchitis   [place] [facility] Marc[facility] Matar, MD   1 year ago Elevated serum creatinine   [place] [facility] [place], [place], [place]   1 year ago COPD with chronic bronchitis [facility])   [place] [facility] Jonah Blue B, MD   2 years ago COPD with chronic bronchitis [facility])   [place] [facility] Jonah Blue B, MD   2 years ago COPD with chronic bronchitis [facility])   [place] [facility] Jonah Blue B, MD              Failed - Lipid Panel in normal range within the last 12 months    Cholesterol, Total  Date Value Ref Range Status  01/22/2021 232 (H) 100 - 199 mg/dL Final   LDL Chol Calc (NIH)  Date Value Ref Range Status  01/22/2021 137 (H) 0 - 99 mg/dL Final   HDL  Date Value Ref Range Status  01/22/2021 53 >39 mg/dL Final   Triglycerides  Date Value Ref Range Status  01/22/2021 233 (H) 0 - 149 mg/dL Final         Passed - Patient is not pregnant

## 2023-08-28 ENCOUNTER — Other Ambulatory Visit: Payer: Self-pay | Admitting: Internal Medicine

## 2023-08-29 NOTE — Telephone Encounter (Signed)
Requested Prescriptions  Refused Prescriptions Disp Refills   atorvastatin (LIPITOR) 10 MG tablet [Pharmacy Med Name: ATORVASTATIN 10 MG TABLET] 90 tablet 1    Sig: TAKE 1 TABLET BY MOUTH EVERY DAY     Cardiovascular:  Antilipid - Statins Failed - 08/28/2023  5:46 PM      Failed - Valid encounter within last 12 months    Recent Outpatient Visits           1 year ago COPD with chronic bronchitis   [place] [facility] Marcine Matar, MD   1 year ago Elevated serum creatinine   [facility] [place], [place], [place]   1 year ago COPD with chronic bronchitis [facility])   [place] [facility] Jonah Blue B, MD   2 years ago COPD with chronic bronchitis [facility])   [place] [facility] Jonah Blue B, MD   2 years ago COPD with chronic bronchitis [facility])   [place] [facility] Jonah Blue B, MD              Failed - Lipid Panel in normal range within the last 12 months    Cholesterol, Total  Date Value Ref Range Status  01/22/2021 232 (H) 100 - 199 mg/dL Final   LDL Chol Calc (NIH)  Date Value Ref Range Status  01/22/2021 137 (H) 0 - 99 mg/dL Final   HDL  Date Value Ref Range Status  01/22/2021 53 >39 mg/dL Final   Triglycerides  Date Value Ref Range Status  01/22/2021 233 (H) 0 - 149 mg/dL Final         Passed - Patient is not pregnant

## 2023-09-19 ENCOUNTER — Other Ambulatory Visit: Payer: Self-pay | Admitting: Internal Medicine

## 2023-09-19 DIAGNOSIS — J4489 Other specified chronic obstructive pulmonary disease: Secondary | ICD-10-CM

## 2023-09-20 NOTE — Telephone Encounter (Signed)
Requested medication (s) are due for refill today: yes  Requested medication (s) are on the active medication list: yes  Last refill:  04/22/23  Future visit scheduled: no  Notes to clinic:  Unable to refill per protocol, courtesy refill already given, routing for provider approval.      Requested Prescriptions  Pending Prescriptions Disp Refills   SPIRIVA HANDIHALER 18 MCG inhalation capsule [Pharmacy Med Name: SPIRIVA HANDIHALER 18 MCG CAP] 30 capsule 2    Sig: INHALE 1 CAPSULE VIA HANDIHALER ONCE DAILY AT THE SAME TIME EVERY DAY     Pulmonology:  Anticholinergic Agents Failed - 09/19/2023  2:01 PM      Failed - Valid encounter within last 12 months    Recent Outpatient Visits           1 year ago COPD with chronic bronchitis   [place] [facility] Marcine Matar, MD   1 year ago Elevated serum creatinine   [facility] [place], [place], [place]   1 year ago COPD with chronic bronchitis [facility])   [place] [facility] Marcine Matar, MD   2 years ago COPD with chronic bronchitis [facility])   [place] [facility] Marcine Matar, MD   2 years ago COPD with chronic bronchitis [facility])   [place] [facility] Marcine Matar, MD
